# Patient Record
Sex: Female | Born: 1970 | Race: White | Hispanic: Yes | Marital: Married | Smoking: Never smoker
Health system: Southern US, Community
[De-identification: ages and names within clinical notes are randomized; demographics above are authoritative.]

## PROBLEM LIST (undated history)

## (undated) DIAGNOSIS — F32A Depression, unspecified: Secondary | ICD-10-CM

## (undated) DIAGNOSIS — I10 Essential (primary) hypertension: Secondary | ICD-10-CM

## (undated) DIAGNOSIS — F329 Major depressive disorder, single episode, unspecified: Secondary | ICD-10-CM

## (undated) DIAGNOSIS — F419 Anxiety disorder, unspecified: Secondary | ICD-10-CM

## (undated) HISTORY — DX: Major depressive disorder, single episode, unspecified: F32.9

## (undated) HISTORY — DX: Essential (primary) hypertension: I10

## (undated) HISTORY — PX: TONSILLECTOMY: SUR1361

## (undated) HISTORY — DX: Depression, unspecified: F32.A

## (undated) HISTORY — DX: Anxiety disorder, unspecified: F41.9

---

## 2003-11-17 ENCOUNTER — Inpatient Hospital Stay (HOSPITAL_COMMUNITY): Admission: AD | Admit: 2003-11-17 | Discharge: 2003-11-17 | Payer: Self-pay | Admitting: *Deleted

## 2003-12-27 ENCOUNTER — Inpatient Hospital Stay (HOSPITAL_COMMUNITY): Admission: AD | Admit: 2003-12-27 | Discharge: 2003-12-28 | Payer: Self-pay | Admitting: Obstetrics & Gynecology

## 2004-01-12 ENCOUNTER — Inpatient Hospital Stay (HOSPITAL_COMMUNITY): Admission: AD | Admit: 2004-01-12 | Discharge: 2004-01-12 | Payer: Self-pay | Admitting: *Deleted

## 2004-01-18 ENCOUNTER — Encounter: Admission: RE | Admit: 2004-01-18 | Discharge: 2004-01-18 | Payer: Self-pay | Admitting: *Deleted

## 2004-01-20 ENCOUNTER — Encounter: Admission: RE | Admit: 2004-01-20 | Discharge: 2004-01-20 | Payer: Self-pay | Admitting: *Deleted

## 2004-02-02 ENCOUNTER — Encounter: Admission: RE | Admit: 2004-02-02 | Discharge: 2004-02-02 | Payer: Self-pay | Admitting: *Deleted

## 2004-02-02 ENCOUNTER — Ambulatory Visit (HOSPITAL_COMMUNITY): Admission: RE | Admit: 2004-02-02 | Discharge: 2004-02-02 | Payer: Self-pay | Admitting: Obstetrics & Gynecology

## 2004-02-09 ENCOUNTER — Encounter: Admission: RE | Admit: 2004-02-09 | Discharge: 2004-02-09 | Payer: Self-pay | Admitting: *Deleted

## 2004-02-23 ENCOUNTER — Encounter: Admission: RE | Admit: 2004-02-23 | Discharge: 2004-02-23 | Payer: Self-pay | Admitting: *Deleted

## 2004-03-01 ENCOUNTER — Ambulatory Visit (HOSPITAL_COMMUNITY): Admission: RE | Admit: 2004-03-01 | Discharge: 2004-03-01 | Payer: Self-pay | Admitting: *Deleted

## 2004-03-08 ENCOUNTER — Encounter: Admission: RE | Admit: 2004-03-08 | Discharge: 2004-03-08 | Payer: Self-pay | Admitting: *Deleted

## 2004-03-15 ENCOUNTER — Encounter: Admission: RE | Admit: 2004-03-15 | Discharge: 2004-03-15 | Payer: Self-pay | Admitting: *Deleted

## 2004-03-22 ENCOUNTER — Encounter: Admission: RE | Admit: 2004-03-22 | Discharge: 2004-03-22 | Payer: Self-pay | Admitting: *Deleted

## 2004-03-29 ENCOUNTER — Inpatient Hospital Stay (HOSPITAL_COMMUNITY): Admission: AD | Admit: 2004-03-29 | Discharge: 2004-03-31 | Payer: Self-pay | Admitting: Obstetrics and Gynecology

## 2004-03-29 ENCOUNTER — Encounter: Admission: RE | Admit: 2004-03-29 | Discharge: 2004-03-29 | Payer: Self-pay | Admitting: *Deleted

## 2004-10-23 IMAGING — US US OB FOLLOW-UP
1 series · 13 of 28 positions shown · non-contrast
Comparison: none

CLINICAL DATA: Lower abdominal pain.  G4 P3 (twins) TAB1.  LMP 07/03/03.  History of placental previa.

[Series 1: unknown · 0.30mm/px · 13 of 48 slices shown]
[im 2/48]
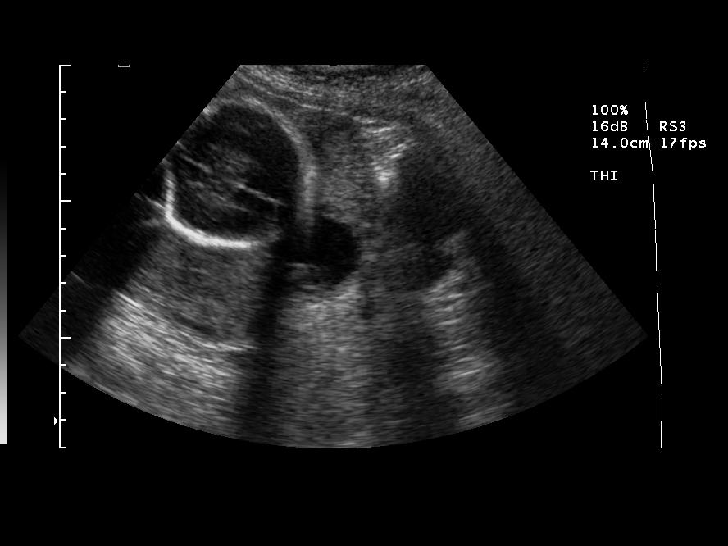
[im 6/48]
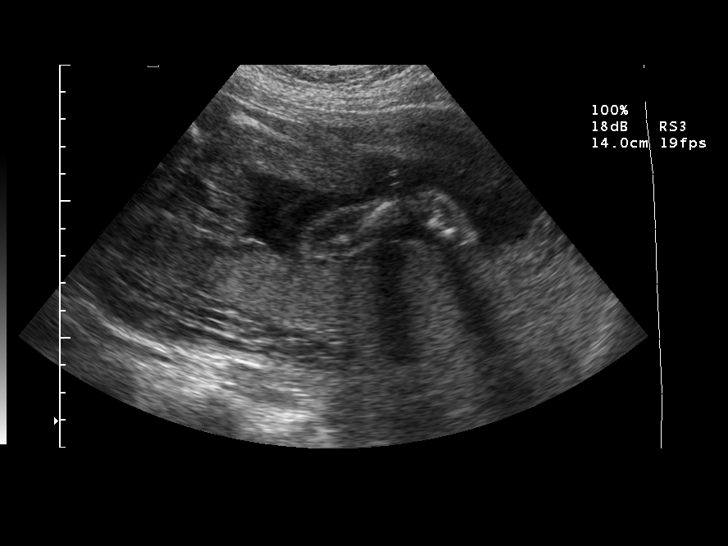
[im 9/48]
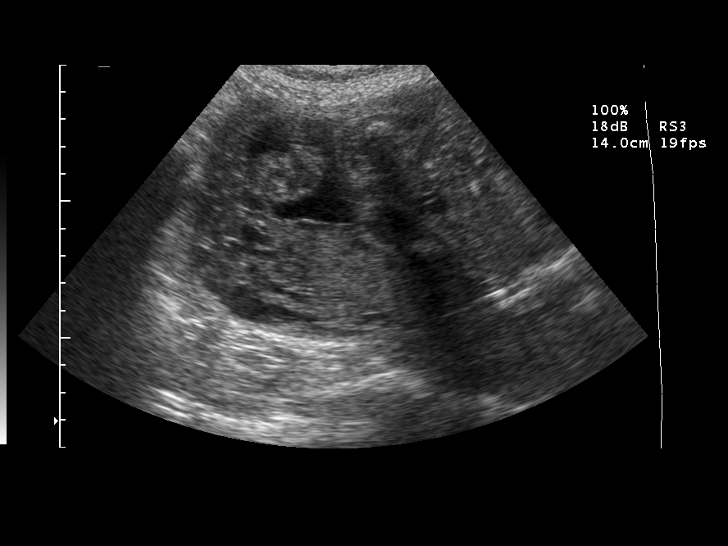
[im 13/48]
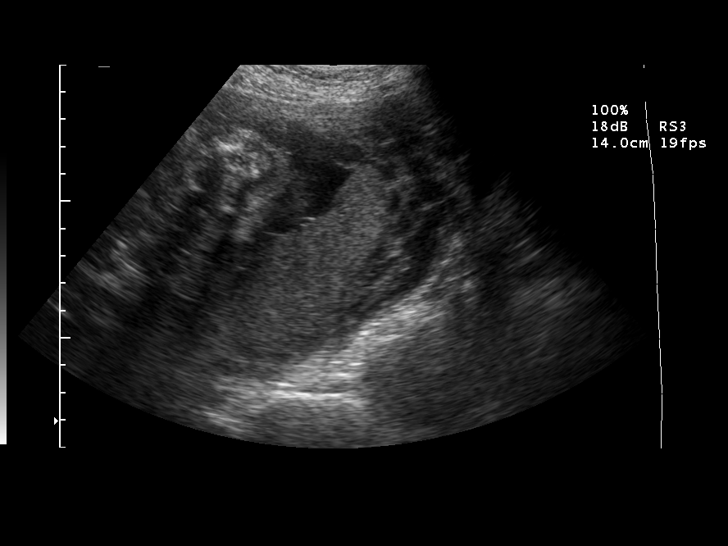
[im 16/48]
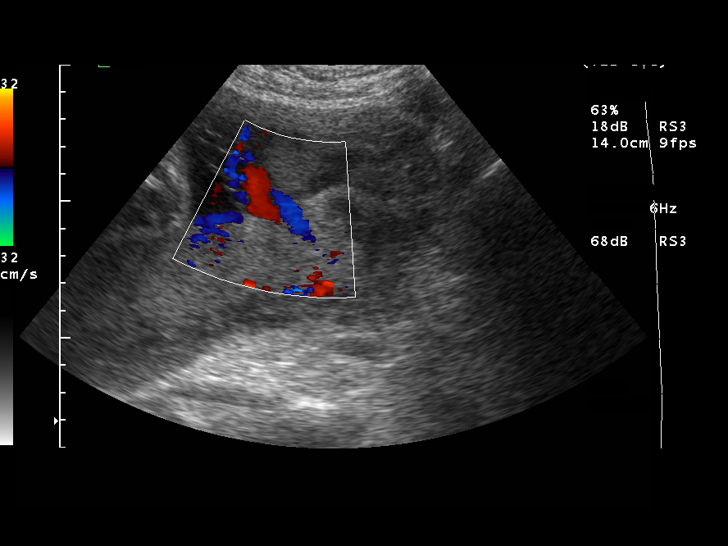
[im 20/48]
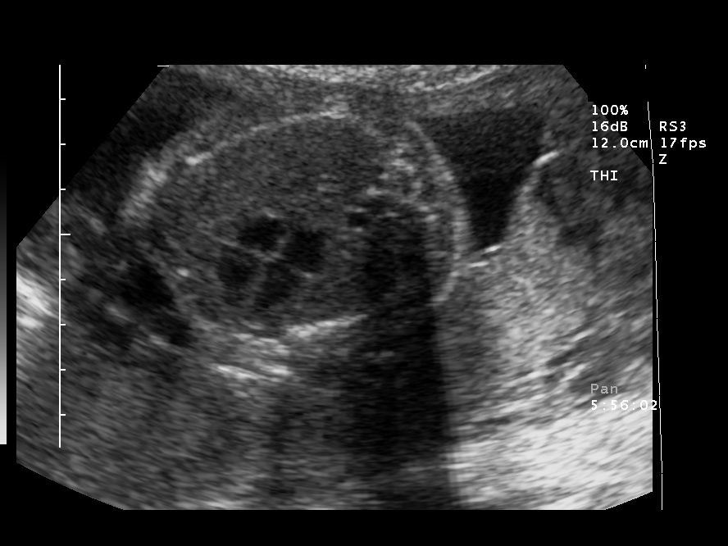
[im 25/48]
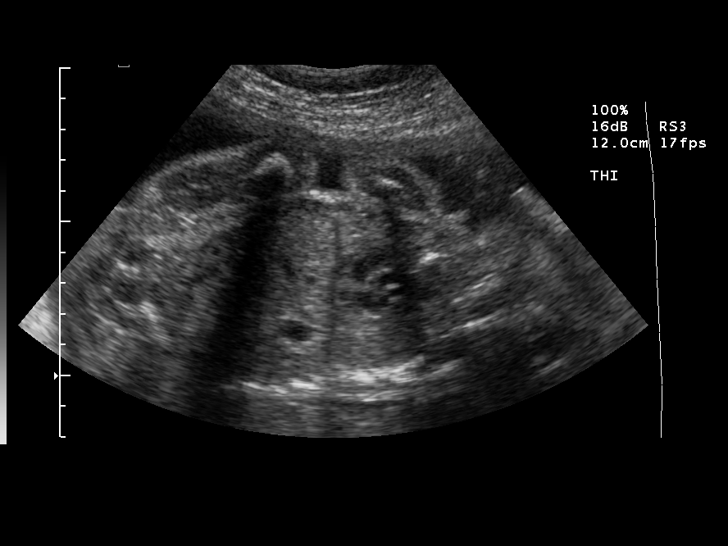
[im 28/48]
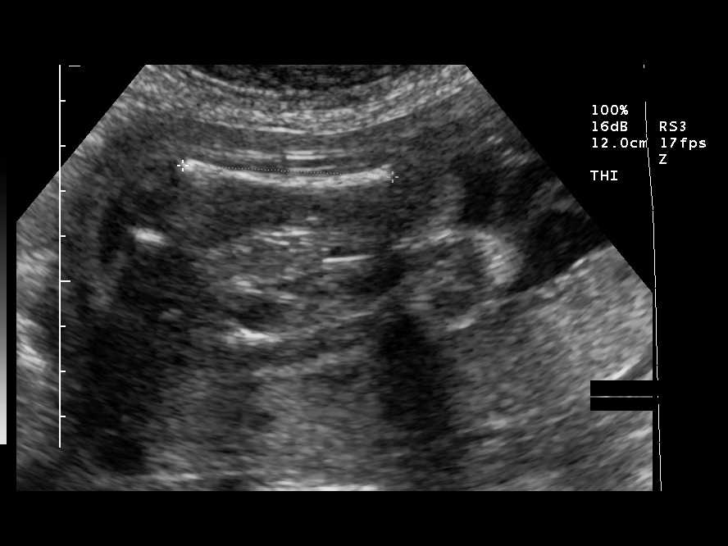
[im 32/48]
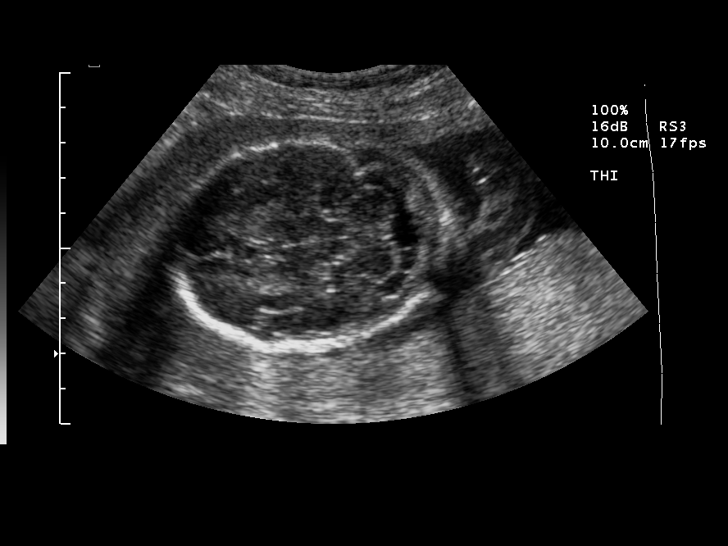
[im 35/48]
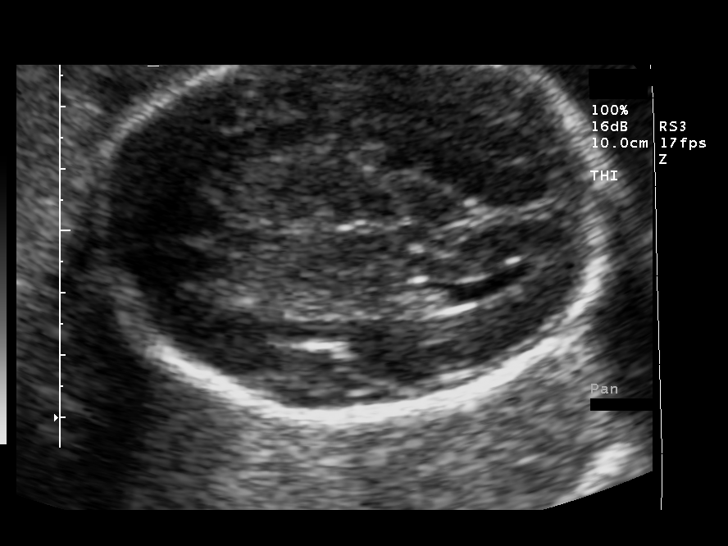
[im 39/48]
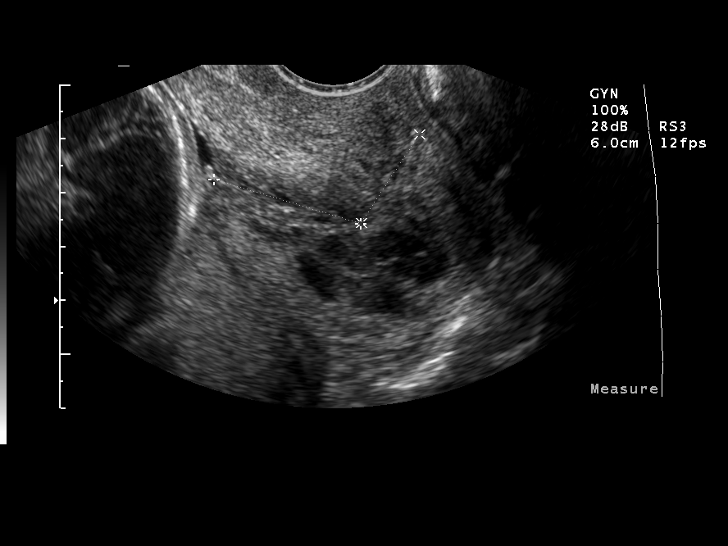
[im 42/48]
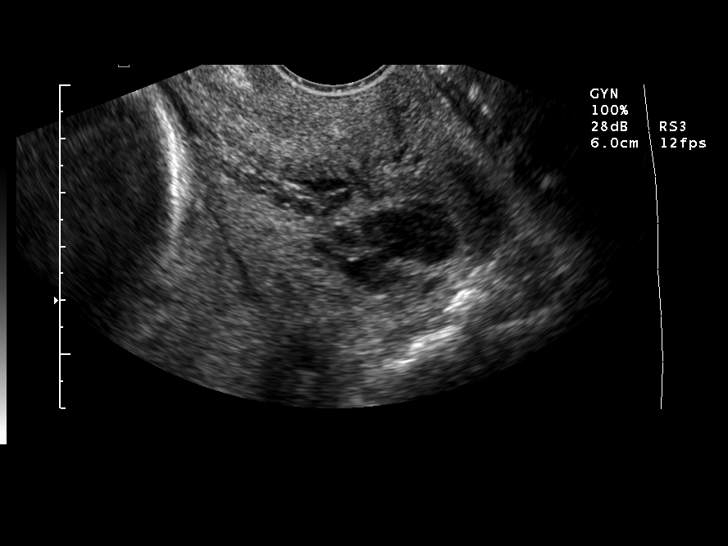
[im 46/48]
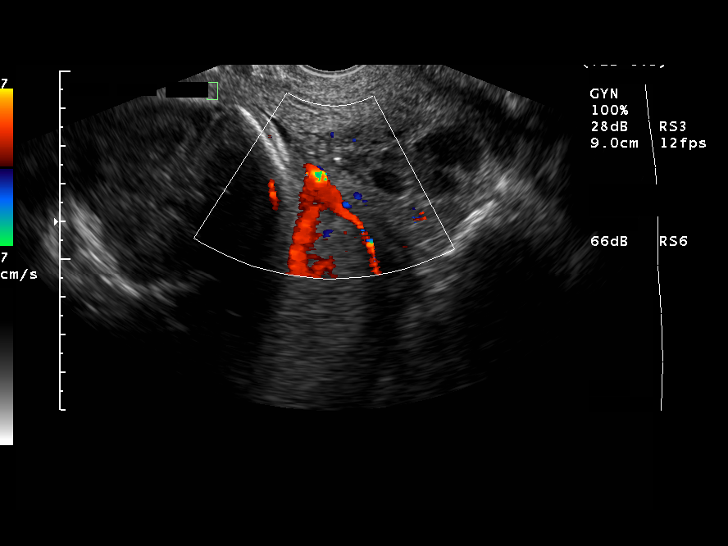

[13 of 28 positions shown; findings below may reference images not displayed]

OBSTETRICAL ULTRASOUND RE-EVALUATION WITH TRANSVAGINAL
Number of Fetuses:  1
Heart Rate: 147
Movement:  Yes
Breathing:  No
Presentation:  Cephalic
Placental Location:  Posterior
Grade:  I
Previa:  Marginal
Comment:  The preliminary report suggested the possibility of vasa previa, but upon examination of the images it appears as though the insertion of the umbilical cord is not at the margin of the placenta.  The vessels noted at the internal os may be marginal placental vessels rather than fetal umbilical vessels.  A follow-up study later in pregnancy may be helpful for further evaluation.  No succenturiate lobe was identified.
Amniotic Fluid (subjective):  Normal
Amniotic Fluid (objective):  4.2 cm Vertical pocket 

FETAL BIOMETRY
BPD:  5.9 cm   24 w 0 d
HC:  23.0 cm   25 w 0 d
AC:  21.1 cm   25 w 4 d
FL:  4.7 cm   25 w 4 d

Mean GA:  25 w 0 d
Assigned GA:  25 w 2 d (1st US)
EFW:  819 g (H) 75th ? 90th%ile (772 ? 889 g) For 25 weeks

FETAL ANATOMY
Lateral Ventricles:  Visualized 
Thalami/CSP:  Visualized 
Posterior Fossa:  Visualized 
Nuchal Region:  Previously seen 
Spine:  Previously seen 
4 Chamber Heart on Left:  Visualized 
Stomach on Left:  Visualized 
3 Vessel Cord:  Visualized 
Cord Insertion Site:  Not visualized 
Kidneys:  Visualized 
Bladder:  Visualized 
Extremities:  Previously seen 

MATERNAL FINDINGS
Cervix:  4.9 cm Transvaginally
IMPRESSION: Single living intrauterine fetus in cephalic presentation.  Patient?s assigned gestational age is 25 weeks 2 days by first ultrasound.  She measures 25 weeks today with a fetal weight of 819 grams, falling between the 75th and 90th percentile for 25 weeks.  Growth is appropriate.  
Normal amniotic fluid volume.
Posterior marginal placenta previa.  The preliminary report suggested the possibility of a vasa previa, however the insertion of the umbilical cord is seen within the placental body and not at the margin of the placenta.  This makes the vessels seen at the internal os more likely to be marginal placental vessels than fetal umbilical vessels.  Follow-up study later in pregnancy may be helpful for further evaluation.
This study was performed as an on-call procedure and was initially reviewed by Dr. Gaeta at the time it was performed.

## 2004-11-08 IMAGING — US US OB LIMITED
1 series · 14 of 28 positions shown · non-contrast
Comparison: none

CLINICAL DATA: Abdominal pain.  History of previa.  Assess cervical length and reevaluate previa.  G4 P3 (twins) AB1.  LMP 09/03/03.

[Series 1: unknown · 0.30mm/px · 14 of 28 slices shown]
[im 2/28]
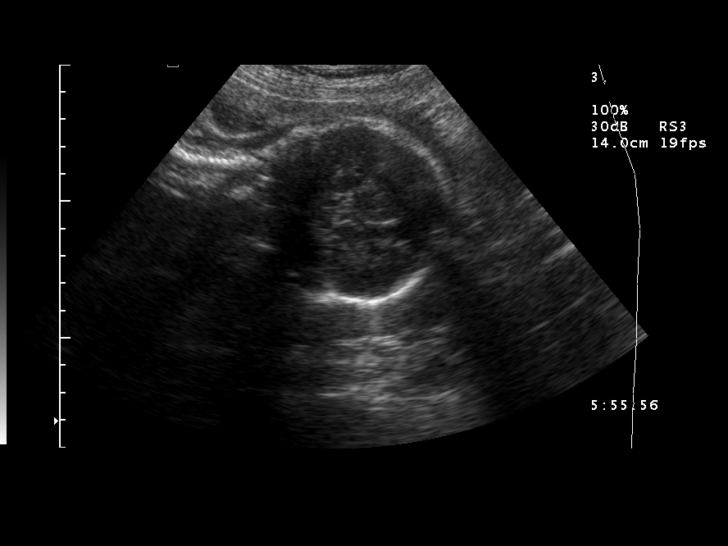
[im 4/28]
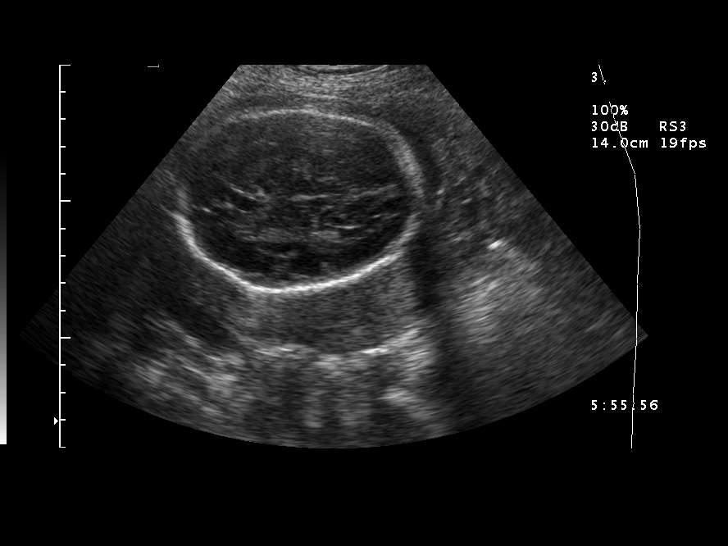
[im 6/28]
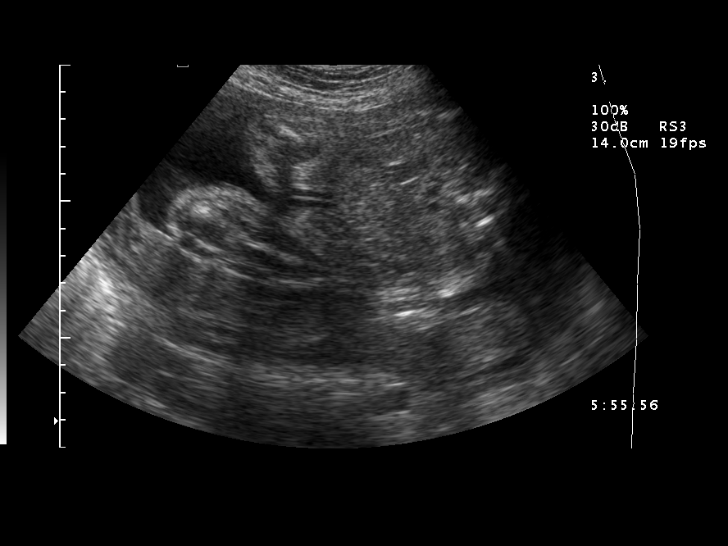
[im 8/28]
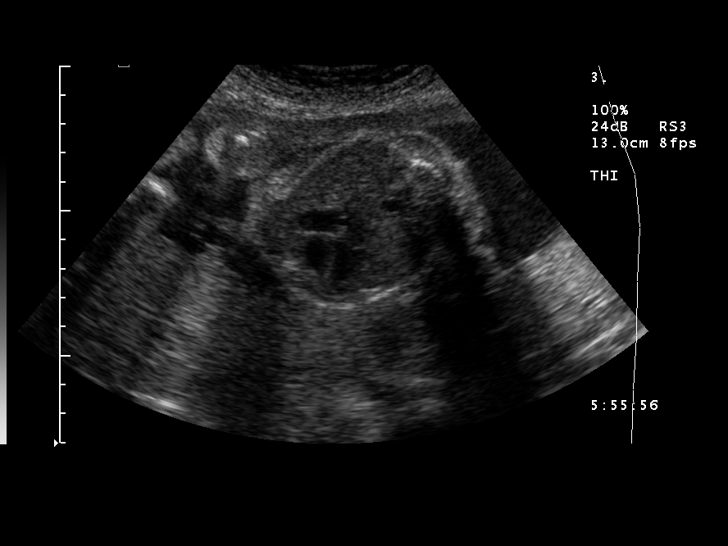
[im 10/28]
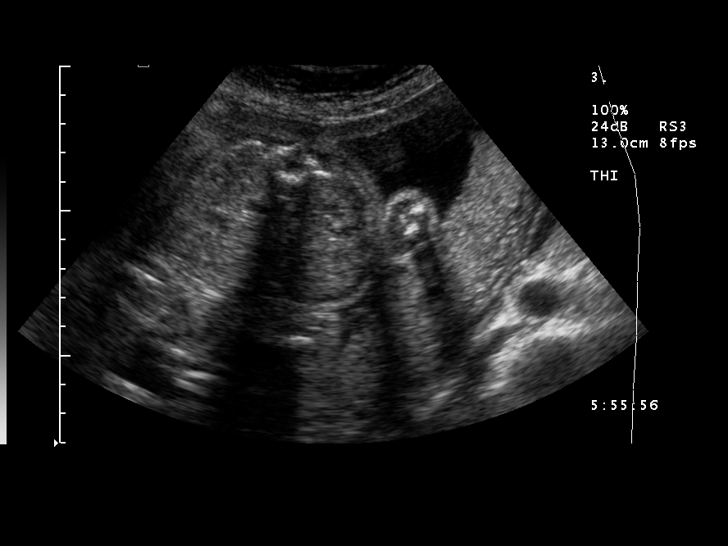
[im 12/28]
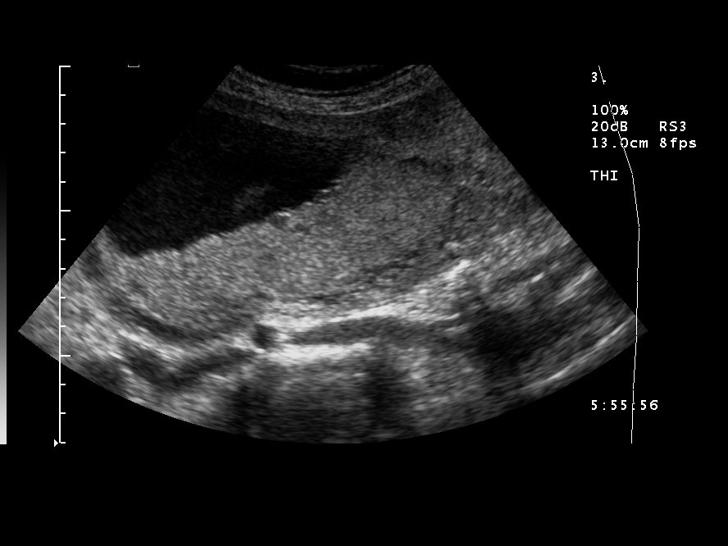
[im 14/28]
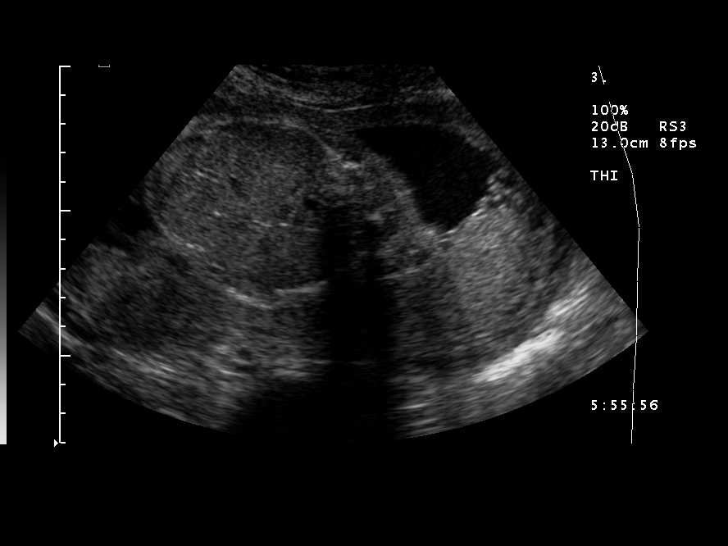
[im 16/28]
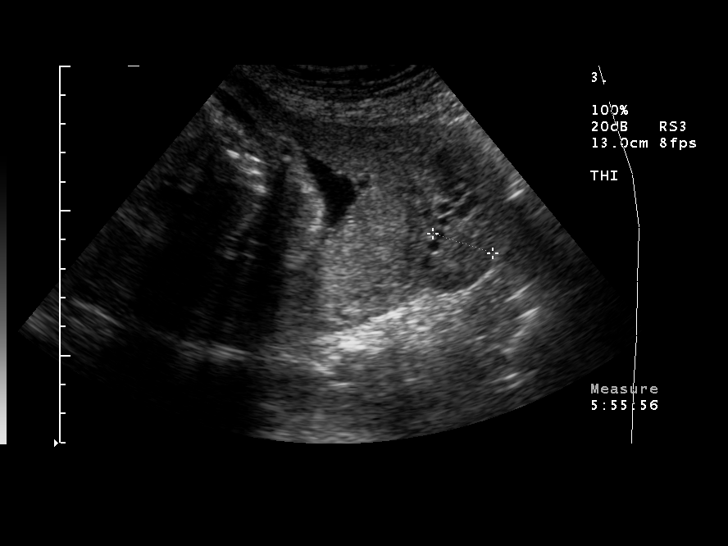
[im 18/28]
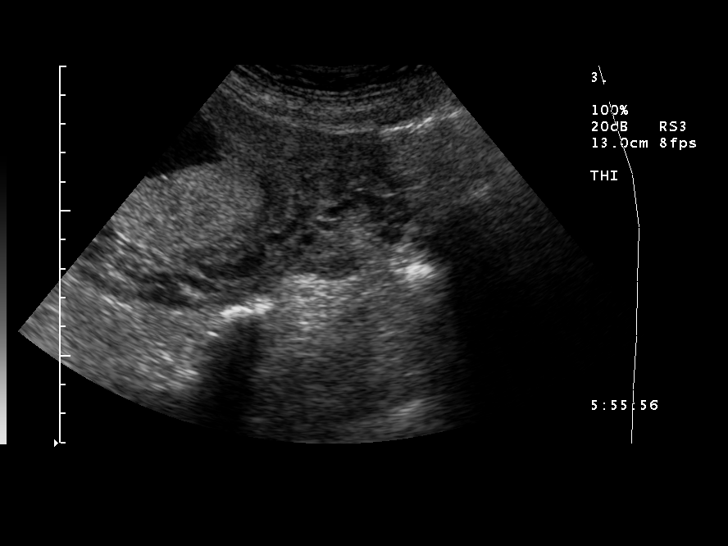
[im 20/28]
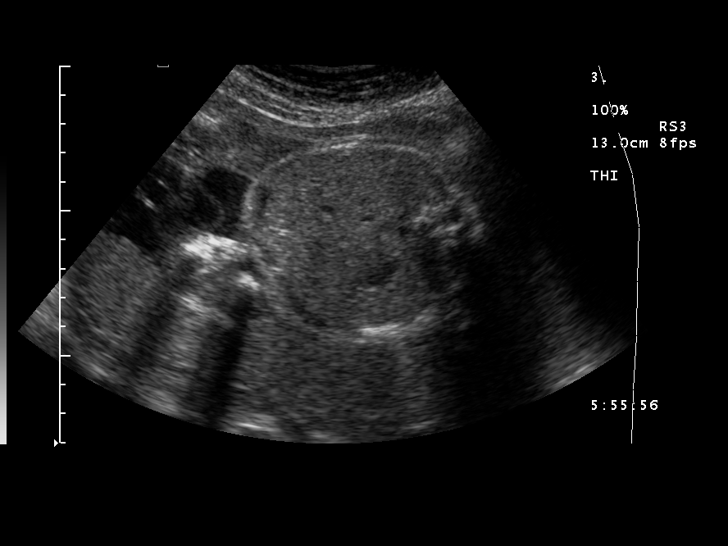
[im 22/28]
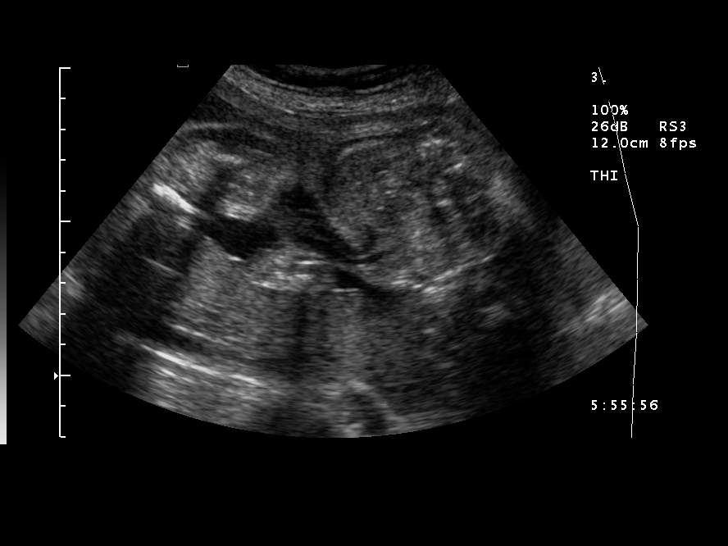
[im 24/28]
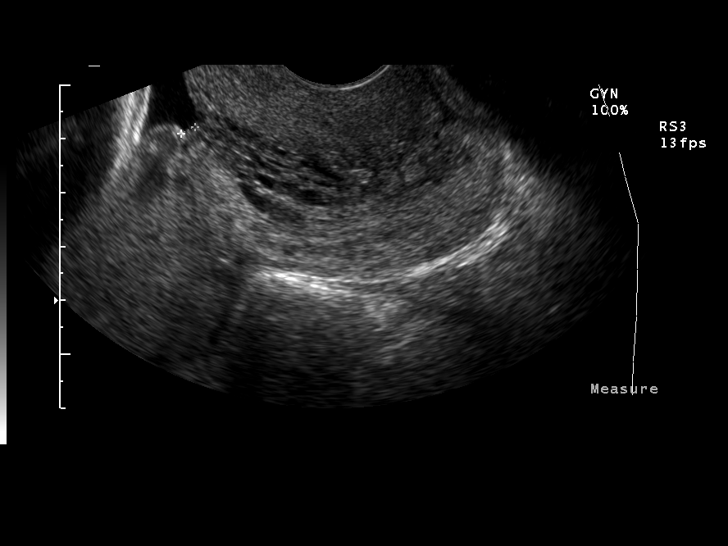
[im 26/28]
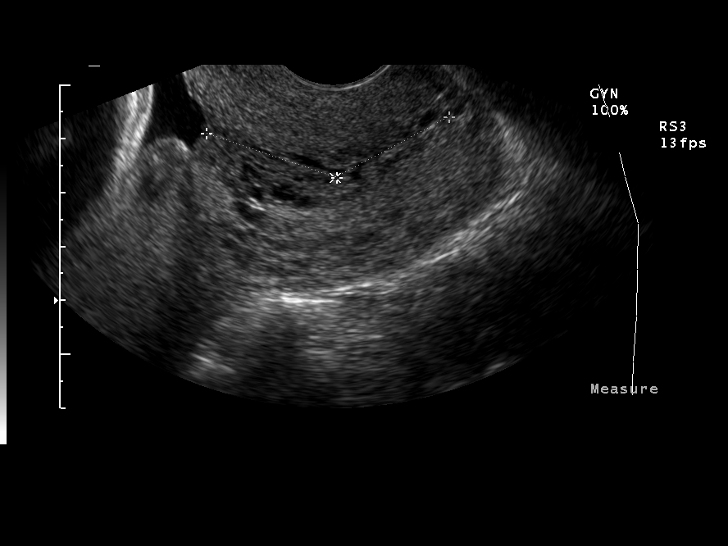
[im 28/28]
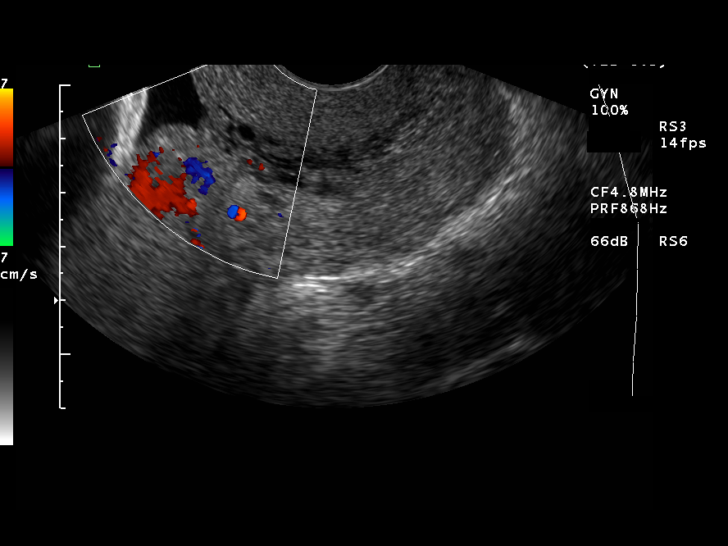

[14 of 28 positions shown; findings below may reference images not displayed]

LIMITED OBSTETRICAL ULTRASOUND WITH TRANSVAGINAL:
 Number of Fetuses:  1
 Heart Rate:  150
 Movement:  Yes
 Breathing:  No
 Presentation:  Cephalic
 Placental Location:  Posterior
 Grade:  I
 Previa:  Marginal
 Amniotic Fluid (Subjective):  Normal
 Amniotic Fluid (Objective):  5.0 cm Vertical pocket 

 Fetal measurements and complete anatomic evaluation were not requested.  The following fetal anatomy was visualized during this exam:  Lateral ventricles, thalami, four chamber heart, stomach, cord insertion site, kidneys and bladder.

 MATERNAL FINDINGS
 Cervix:  5.0 cm Transvaginally
IMPRESSION: Single living intrauterine fetus in cephalic presentation.  Posterior marginal placenta previa again noted.  
 Normal amniotic fluid volume.  
 Normal cervical length of 5 cm on transvaginal scanning. 
 This study was performed as an on-call procedure and was reviewed by Dr. Elisaul Aviila at the time it was performed.

## 2004-12-27 IMAGING — US US OB FOLLOW-UP
1 series · 13 of 28 positions shown · non-contrast
Comparison: none

CLINICAL DATA: Assess growth.  Follow-up possible previa.  G4 P3 (twins) AB1.  LMP 07/03/03.

[Series 1: unknown · 13 of 34 slices shown]
[im 2/34]
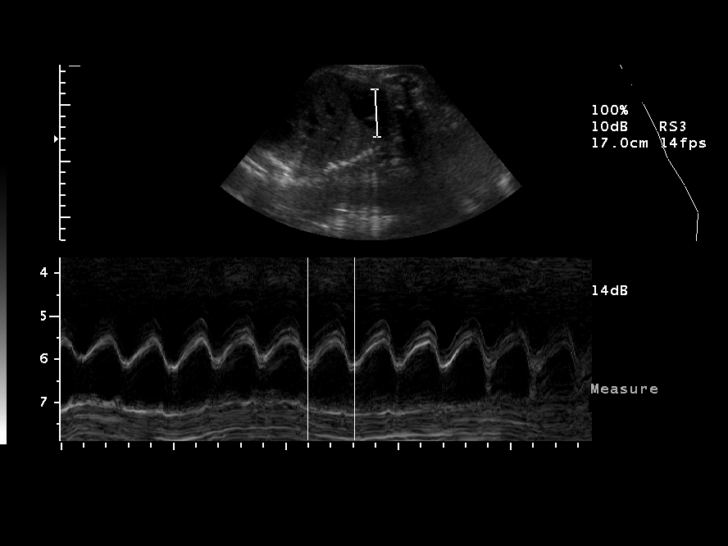
[im 4/34]
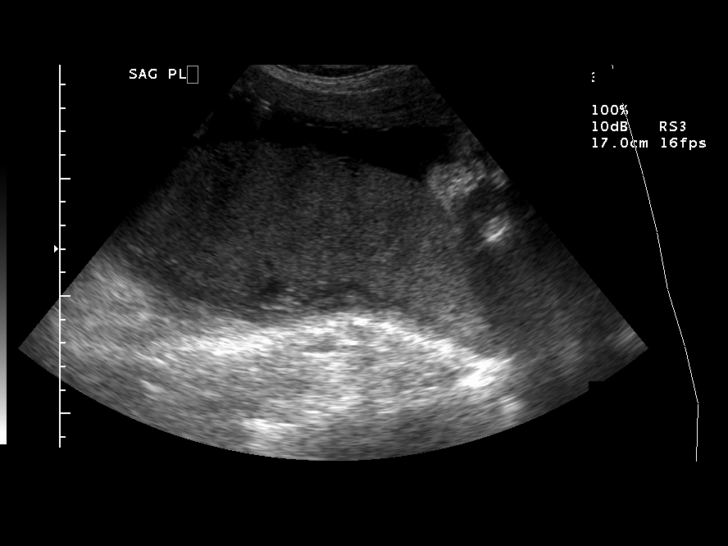
[im 7/34]
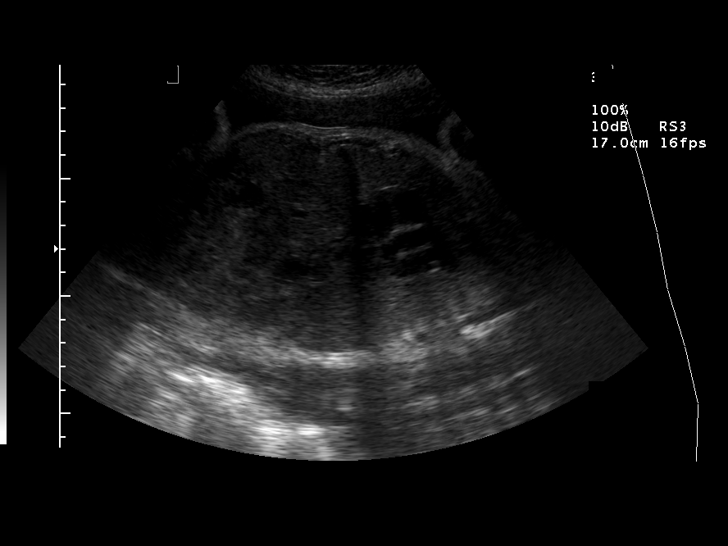
[im 9/34]
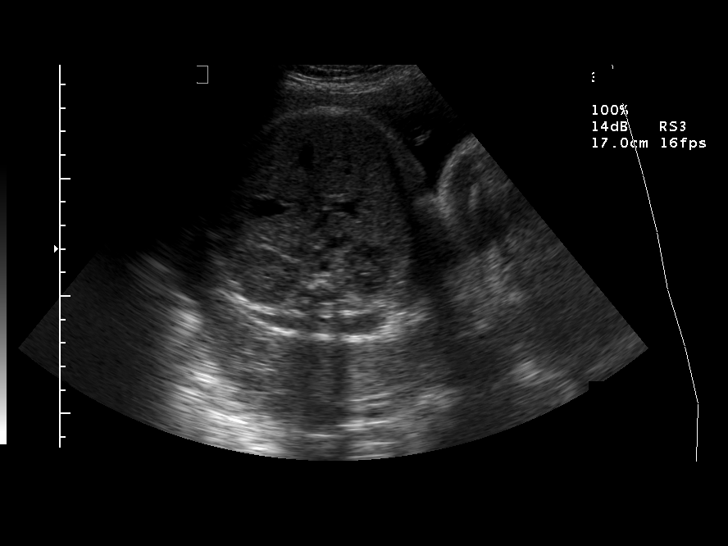
[im 12/34]
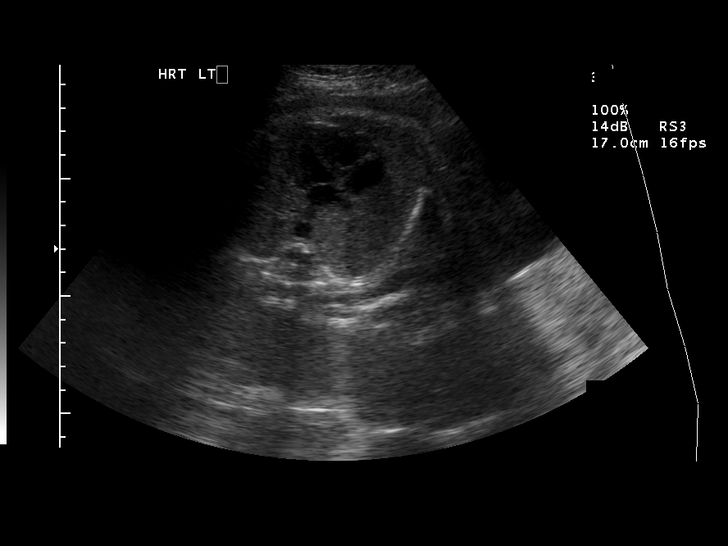
[im 14/34]
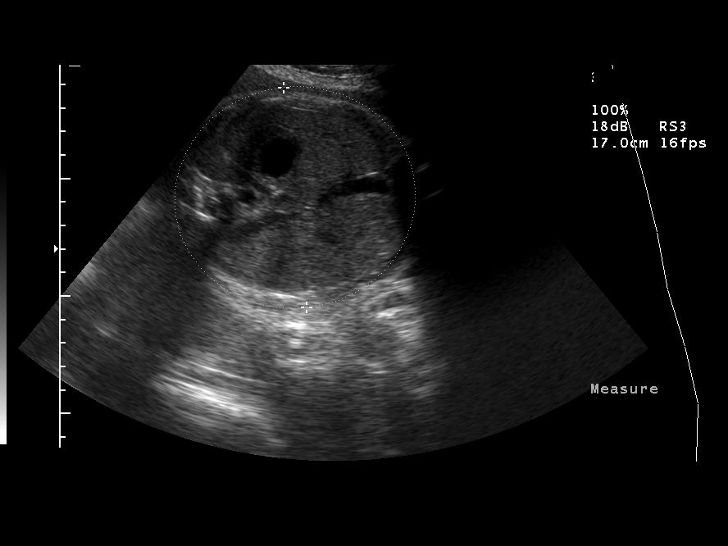
[im 18/34]
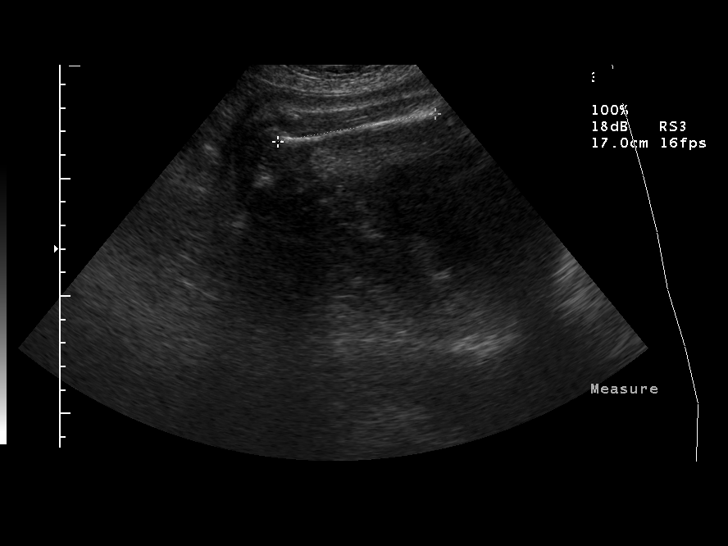
[im 20/34]
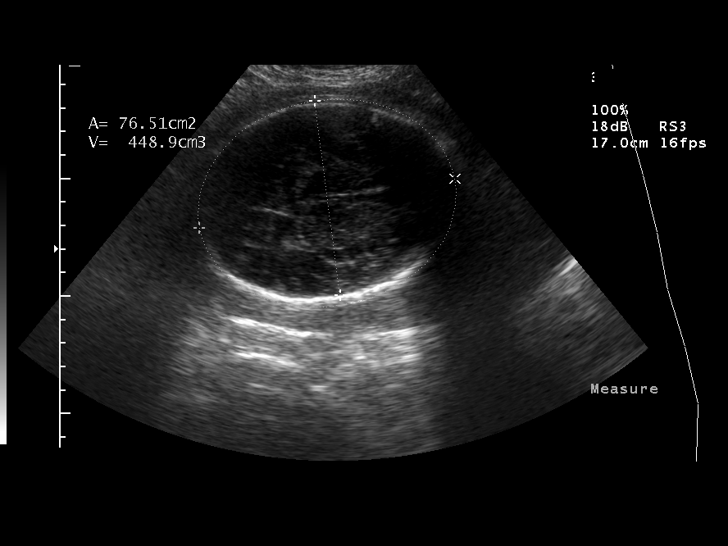
[im 23/34]
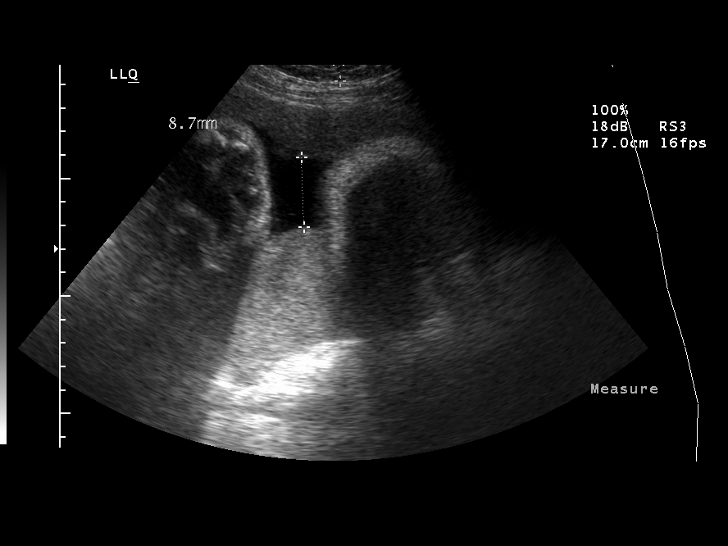
[im 25/34]
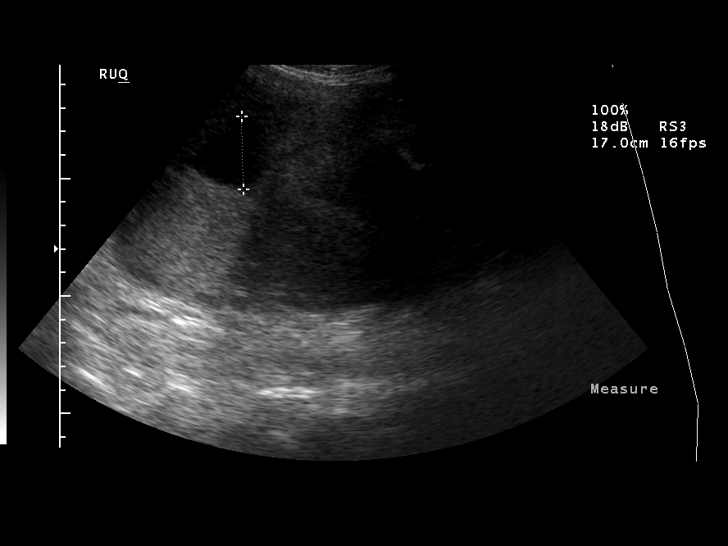
[im 27/34]
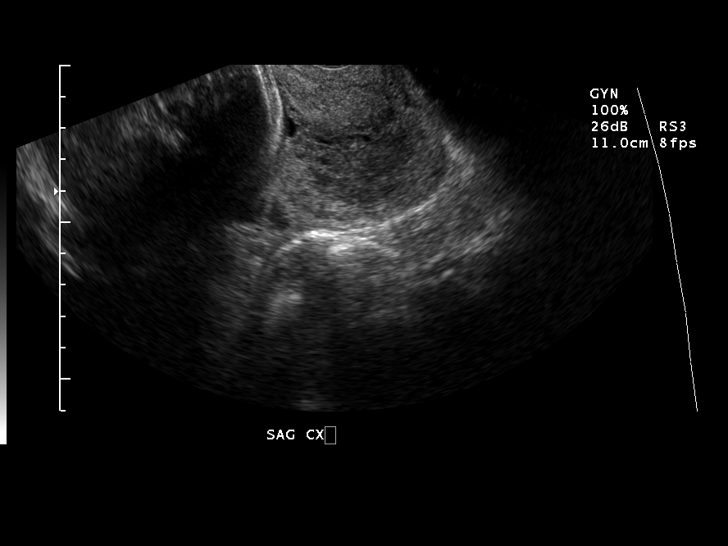
[im 30/34]
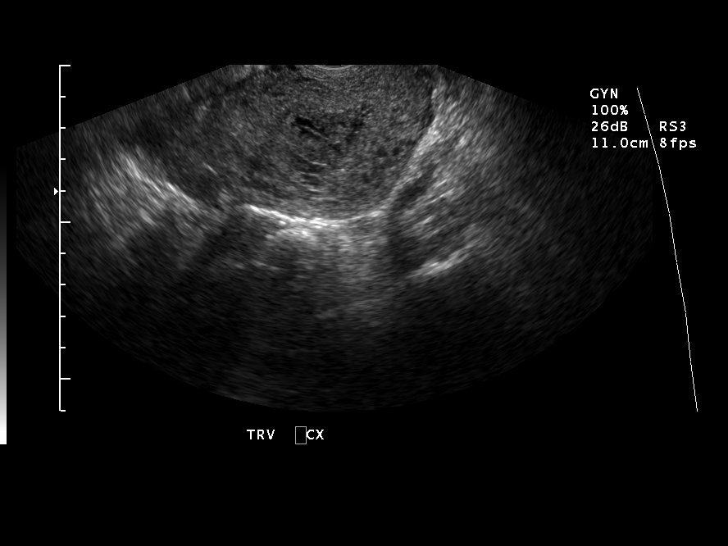
[im 32/34]
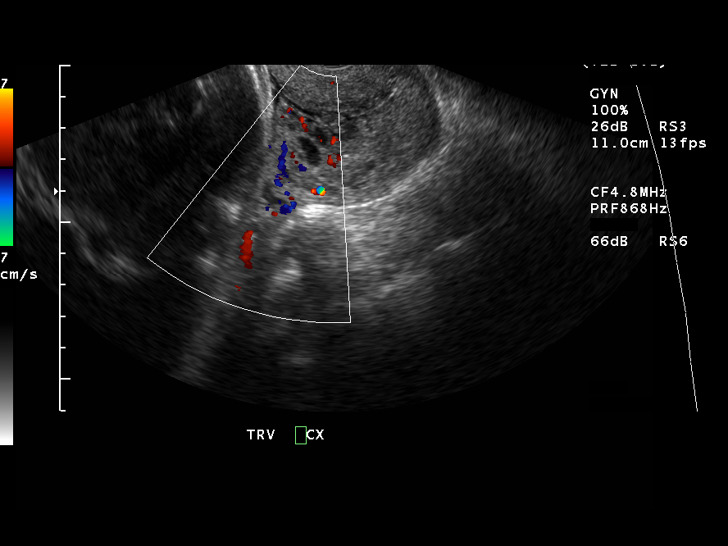

[13 of 28 positions shown; findings below may reference images not displayed]

OBSTETRICAL ULTRASOUND RE-EVALUATION WITH TRANSVAGINAL
 Number of Fetuses:  1
 Heart Rate:  145
 Movement:  Yes
 Breathing:  Yes
 Presentation:  Cephalic
 Placental Location:  Posterior
 Grade:  I
 Previa:  No
 Amniotic Fluid (subjective):  Normal
 Amniotic Fluid (objective):  12.0 cm AFI (5th -95th%ile =   7.9 – 24.9 cm for 35 weeks)

 FETAL BIOMETRY
 BPD:  8.5 cm   34 w 0 d
 HC:  31.3 cm   35 w 0 d
 AC:  30.8 cm   34 w 5 d
 FL:  6.8 cm   34 w 6 d

 Mean GA:  34 w 5 d
 Assigned GA:  34 w 4 d (1st US and LMP)

 EFW:  5631 g (H) 50th – 75th%ile (8080 – 1244 g) For 35 wks

 FETAL ANATOMY
 Lateral Ventricles:  Previously seen 
 Thalami/CSP:  Previously seen 
 Posterior Fossa:  Previously seen 
 Nuchal Region:  N/A
 Spine:  Previously seen 
 4 Chamber Heart on Left:  Previously seen 
 Stomach on Left:  Visualized 
 3 Vessel Cord:  Previously seen 
 Cord Insertion Site:  Previously seen 
 Kidneys:  Visualized 
 Bladder:  Visualized 
 Extremities:  Previously seen 

 MATERNAL FINDINGS
 Cervix: 3.2 cm Transvaginally
IMPRESSION: Single living intrauterine fetus in cephalic presentation.  Patient is 34 weeks 4 days by LMP dating and measures 34 weeks 5 days today with a fetal weight of 5631 grams, falling between the 50th and 75th percentile for 35 weeks.  Growth is appropriate.  
 Normal amniotic fluid volume with AFI 12 cm.
 There is no longer any evidence of placenta previa.  The lower margin is at least 4 cm above the internal os.
 Normal cervical length of 3.2 cm on transvaginally scanning.

## 2007-07-03 ENCOUNTER — Emergency Department (HOSPITAL_COMMUNITY): Admission: EM | Admit: 2007-07-03 | Discharge: 2007-07-03 | Payer: Self-pay | Admitting: Emergency Medicine

## 2011-10-25 ENCOUNTER — Emergency Department (HOSPITAL_COMMUNITY)
Admission: EM | Admit: 2011-10-25 | Discharge: 2011-10-26 | Disposition: A | Discharge status: Home / Self Care | Payer: Self-pay | Attending: Emergency Medicine | Admitting: Emergency Medicine

## 2011-10-25 DIAGNOSIS — R110 Nausea: Secondary | ICD-10-CM | POA: Insufficient documentation

## 2011-10-25 DIAGNOSIS — R109 Unspecified abdominal pain: Secondary | ICD-10-CM | POA: Insufficient documentation

## 2011-10-25 LAB — CBC
HCT: 40.5 % (ref 36.0–46.0)
Hemoglobin: 14.6 g/dL (ref 12.0–15.0)
MCH: 31.5 pg (ref 26.0–34.0)
MCHC: 36 g/dL (ref 30.0–36.0)
MCV: 87.3 fL (ref 78.0–100.0)
RBC: 4.64 MIL/uL (ref 3.87–5.11)

## 2011-10-25 LAB — BASIC METABOLIC PANEL
BUN: 7 mg/dL (ref 6–23)
CO2: 23 mEq/L (ref 19–32)
Calcium: 9.3 mg/dL (ref 8.4–10.5)
Creatinine, Ser: 0.55 mg/dL (ref 0.50–1.10)
GFR calc non Af Amer: >90 mL/min (ref >90)
Glucose, Bld: 183 mg/dL — ABNORMAL HIGH (ref 70–99)

## 2011-10-25 LAB — DIFFERENTIAL
Basophils Relative: 0 % (ref 0–1)
Lymphs Abs: 3 10*3/uL (ref 0.7–4.0)
Monocytes Absolute: 0.4 10*3/uL (ref 0.1–1.0)
Monocytes Relative: 4 % (ref 3–12)
Neutro Abs: 7.5 10*3/uL (ref 1.7–7.7)

## 2011-10-25 LAB — URINALYSIS, ROUTINE W REFLEX MICROSCOPIC
Glucose, UA: NEGATIVE mg/dL
Hgb urine dipstick: NEGATIVE
Specific Gravity, Urine: 1.008 (ref 1.005–1.030)

## 2011-10-25 LAB — POCT PREGNANCY, URINE: Preg Test, Ur: NEGATIVE

## 2011-10-26 ENCOUNTER — Emergency Department (HOSPITAL_COMMUNITY): Payer: Self-pay

## 2011-10-26 LAB — HEPATIC FUNCTION PANEL
ALT: 12 U/L (ref 0–35)
Albumin: 4.1 g/dL (ref 3.5–5.2)
Alkaline Phosphatase: 69 U/L (ref 39–117)
Total Protein: 7.8 g/dL (ref 6.0–8.3)

## 2011-10-26 LAB — LIPASE, BLOOD: Lipase: 32 U/L (ref 11–59)

## 2011-10-26 MED ORDER — IOHEXOL 300 MG/ML  SOLN
80.0000 mL | Freq: Once | INTRAMUSCULAR | Status: AC | PRN
  Administered 2011-10-26: 80 mL via INTRAVENOUS

## 2014-04-07 ENCOUNTER — Encounter: Payer: Self-pay | Admitting: Obstetrics & Gynecology

## 2014-04-07 ENCOUNTER — Ambulatory Visit (INDEPENDENT_AMBULATORY_CARE_PROVIDER_SITE_OTHER): Payer: Self-pay | Admitting: Obstetrics & Gynecology

## 2014-04-07 VITALS — BP 152/93 | HR 98 | Temp 98.1°F | Resp 20 | Wt 150.6 lb

## 2014-04-07 DIAGNOSIS — D219 Benign neoplasm of connective and other soft tissue, unspecified: Secondary | ICD-10-CM

## 2014-04-07 DIAGNOSIS — N852 Hypertrophy of uterus: Secondary | ICD-10-CM | POA: Insufficient documentation

## 2014-04-07 DIAGNOSIS — N920 Excessive and frequent menstruation with regular cycle: Secondary | ICD-10-CM

## 2014-04-07 DIAGNOSIS — D259 Leiomyoma of uterus, unspecified: Secondary | ICD-10-CM

## 2014-04-07 LAB — CBC
HCT: 38.8 % (ref 36.0–46.0)
Hemoglobin: 13.4 g/dL (ref 12.0–15.0)
MCH: 29.1 pg (ref 26.0–34.0)
MCHC: 34.5 g/dL (ref 30.0–36.0)
MCV: 84.3 fL (ref 78.0–100.0)
Platelets: 329 10*3/uL (ref 150–400)
RBC: 4.6 MIL/uL (ref 3.87–5.11)
RDW: 15.2 % (ref 11.5–15.5)
WBC: 8.6 10*3/uL (ref 4.0–10.5)

## 2014-04-07 LAB — TSH: TSH: 1.972 u[IU]/mL (ref 0.350–4.500)

## 2014-04-07 NOTE — Assessment & Plan Note (Addendum)
Check CBC, TSH, pelvic ultrasound Likely secondary to fibroids Anticipate will need abdominal hysterectomy. F/u in 2 weeks to review u/s and likely schedule surgery

## 2014-04-07 NOTE — Progress Notes (Signed)
  Due to language barrier, an interpreter was present during the history-taking and subsequent discussion.  HPI:  Pt presents for a new GYN appointment. She was referred to [facility] by the health department for an enlarged uterus. She notes that especially for the last 2 years, she has had painful, heavy periods. She gets periods every 18-20 days. Does have some bleeding in between periods as well. Over the last 2-3 months her uterus has gotten even larger. Uses four pads/day when bleeding is heaviest. Has not had any ultrasounds.  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 Had colposcopy at that time and since has had normal yearly paps, thinks her last pap was 1.5 years ago. Was tested for STD's one month ago at [facility] and that was negative.  Sexually active with one female partner. Wants BTL, is done with bearing children.  ROS: See HPI  OB Hx: [id number] (hx twin delivery, one 3 month miscarriage, all other pregnancies SVD's at term, GDM with last 2 periods)  PMHx: HTN & depression/anxiety  Surgical Hx: hx tonsillectomy, no prior abdominal surgeries  PHYSICAL EXAM: BP 152/93  Pulse 98  Temp(Src) 98.1 F (36.7 C) (Oral)  Resp 20  Wt 150 lb 9.6 oz (68.312 kg)  LMP 03/23/2014 Gen: NAD, pleasant, cooperative HEENT: NCAT Lungs: NWOB Abdomen: soft, nontender to palpation. Uterus enlarged up to level of umbilicus, approximately 18-20 week in size GU: deferred Neuro: grossly nonfocal, speech normal  ASSESSMENT/PLAN:  See problem-based charting for assessment/plan.  FOLLOW UP: F/u in 2 weeks to review u/s and likely schedule surgery.  [place]. Ardelia Mems, MD Family Medicine PGY-2

## 2014-04-07 NOTE — Progress Notes (Signed)
Ultrasound scheduled for 04/14/14 at 0730

## 2014-04-14 ENCOUNTER — Ambulatory Visit (HOSPITAL_COMMUNITY)
Admission: RE | Admit: 2014-04-14 | Discharge: 2014-04-14 | Disposition: A | Discharge status: Home / Self Care | Payer: Self-pay | Source: Ambulatory Visit | Attending: Obstetrics & Gynecology | Admitting: Obstetrics & Gynecology

## 2014-04-14 DIAGNOSIS — N938 Other specified abnormal uterine and vaginal bleeding: Secondary | ICD-10-CM | POA: Insufficient documentation

## 2014-04-14 DIAGNOSIS — N949 Unspecified condition associated with female genital organs and menstrual cycle: Secondary | ICD-10-CM | POA: Insufficient documentation

## 2014-04-14 DIAGNOSIS — D259 Leiomyoma of uterus, unspecified: Secondary | ICD-10-CM | POA: Insufficient documentation

## 2014-04-14 DIAGNOSIS — N920 Excessive and frequent menstruation with regular cycle: Secondary | ICD-10-CM | POA: Insufficient documentation

## 2014-04-14 DIAGNOSIS — D219 Benign neoplasm of connective and other soft tissue, unspecified: Secondary | ICD-10-CM

## 2014-04-19 ENCOUNTER — Encounter: Payer: Self-pay | Admitting: *Deleted

## 2014-05-05 ENCOUNTER — Ambulatory Visit (INDEPENDENT_AMBULATORY_CARE_PROVIDER_SITE_OTHER): Payer: Self-pay | Admitting: Obstetrics & Gynecology

## 2014-05-05 ENCOUNTER — Encounter: Payer: Self-pay | Admitting: Obstetrics & Gynecology

## 2014-05-05 VITALS — BP 145/85 | HR 93 | Temp 97.4°F | Resp 20 | Wt 149.4 lb

## 2014-05-05 DIAGNOSIS — D259 Leiomyoma of uterus, unspecified: Secondary | ICD-10-CM

## 2014-05-05 DIAGNOSIS — D219 Benign neoplasm of connective and other soft tissue, unspecified: Secondary | ICD-10-CM

## 2014-05-05 NOTE — Progress Notes (Signed)
   Subjective:    Patient ID: Jenna Noble, female    DOB: April 15, 1971, 43 y.o.   MRN: [id number]  HPI This 43 yo MH P5 is here to schedule surgery for her large painful fibroid uterus. She also reports heavy painful periods. She denies dyspareunia but rarely has orgasms. She describes herself as "frigid".    Review of Systems Her last pap was reportedly about a year ago and she says that they have always been normal.    Objective:   Physical Exam Uterus at u-3       Assessment & Plan:  Symptomatic fibroid uterus Plan for TAH/BS.  She understands the risks of surgery, including, but not to infection, bleeding, DVTs, damage to bowel, bladder, ureters. She wishes to proceed.

## 2014-06-24 ENCOUNTER — Telehealth: Payer: Self-pay | Admitting: *Deleted

## 2014-06-24 NOTE — Telephone Encounter (Signed)
Pt called clinic this morning to discuss some concerns. I spoke to her with interpreter - Dorita. She reported that her abdomen is very swollen and uncomfortable. She feels it became worse about 3 weeks ago. She states that sometimes she cannot eat because of the swelling, pressure and also has pain. She is scheduled for surgery (hysterectomy) on 7/27 and wants to know if there is anything she can do before the surgery. I advised pt that she can try OTC Zantac which may help her be able to eat. There is nothing that can be done about her swollen abdomen prior to surgery. She has a very large fibroid which is causing the pain and pressure. If she feels it becomes unbearable and has severe pain, she should go to [facility] for evaluation. Pt voiced understanding.

## 2014-07-16 ENCOUNTER — Other Ambulatory Visit: Payer: Self-pay

## 2014-07-16 ENCOUNTER — Encounter (HOSPITAL_COMMUNITY): Payer: Self-pay

## 2014-07-16 ENCOUNTER — Encounter (HOSPITAL_COMMUNITY)
Admission: RE | Admit: 2014-07-16 | Discharge: 2014-07-16 | Disposition: A | Discharge status: Home / Self Care | Payer: Self-pay | Source: Ambulatory Visit | Attending: Obstetrics & Gynecology | Admitting: Obstetrics & Gynecology

## 2014-07-16 DIAGNOSIS — Z01818 Encounter for other preprocedural examination: Secondary | ICD-10-CM | POA: Insufficient documentation

## 2014-07-16 DIAGNOSIS — Z01812 Encounter for preprocedural laboratory examination: Secondary | ICD-10-CM | POA: Insufficient documentation

## 2014-07-16 LAB — BASIC METABOLIC PANEL
Anion gap: 16 — ABNORMAL HIGH (ref 5–15)
BUN: 6 mg/dL (ref 6–23)
CO2: 22 mEq/L (ref 19–32)
Calcium: 9.6 mg/dL (ref 8.4–10.5)
Chloride: 99 mEq/L (ref 96–112)
Creatinine, Ser: 0.6 mg/dL (ref 0.50–1.10)
GFR calc Af Amer: >90 mL/min (ref >90)
GFR calc non Af Amer: >90 mL/min (ref >90)
Glucose, Bld: 110 mg/dL — ABNORMAL HIGH (ref 70–99)
Potassium: 3.4 mEq/L — ABNORMAL LOW (ref 3.7–5.3)
Sodium: 137 mEq/L (ref 137–147)

## 2014-07-16 LAB — CBC
HCT: 38.7 % (ref 36.0–46.0)
Hemoglobin: 13.2 g/dL (ref 12.0–15.0)
MCH: 28.5 pg (ref 26.0–34.0)
MCHC: 34.1 g/dL (ref 30.0–36.0)
MCV: 83.6 fL (ref 78.0–100.0)
Platelets: 268 10*3/uL (ref 150–400)
RBC: 4.63 MIL/uL (ref 3.87–5.11)
RDW: 15 % (ref 11.5–15.5)
WBC: 7.9 10*3/uL (ref 4.0–10.5)

## 2014-07-16 NOTE — Patient Instructions (Signed)
Garvin Laurin Coder  07/16/2014   Your procedure is scheduled on:  07/19/14  Enter through the Main Entrance of [facility] at [place] up the phone at the desk and dial [phone].   Call this number if you have problems the morning of surgery: [phone]   Remember:   Do not eat food:After Midnight.  Do not drink clear liquids: After Midnight.  Take these medicines the morning of surgery with A SIP OF WATER: NA   Do not wear jewelry, make-up or nail polish.  Do not wear lotions, powders, or perfumes. You may wear deodorant.  Do not shave 48 hours prior to surgery.  Do not bring valuables to the hospital.  [facility] is not   responsible for any belongings or valuables brought to the hospital.  Contacts, dentures or bridgework may not be worn into surgery.  Leave suitcase in the car. After surgery it may be brought to your room.  For patients admitted to the hospital, checkout time is 11:00 AM the day of              discharge.   Patients discharged the day of surgery will not be allowed to drive             home.  Name and phone number of your driver: NA  Special Instructions:      Please read over the following fact sheets that you were given:   Surgical Site Infection Prevention

## 2014-07-19 ENCOUNTER — Encounter (HOSPITAL_COMMUNITY): Payer: Self-pay | Admitting: Anesthesiology

## 2014-07-19 ENCOUNTER — Inpatient Hospital Stay (HOSPITAL_COMMUNITY)
Admission: RE | Admit: 2014-07-19 | Discharge: 2014-07-20 | DRG: 743 | Disposition: A | Discharge status: Home / Self Care | Payer: Self-pay | Source: Ambulatory Visit | Attending: Obstetrics & Gynecology | Admitting: Obstetrics & Gynecology

## 2014-07-19 ENCOUNTER — Encounter (HOSPITAL_COMMUNITY): Payer: Self-pay | Admitting: Certified Registered"

## 2014-07-19 ENCOUNTER — Inpatient Hospital Stay (HOSPITAL_COMMUNITY): Payer: Self-pay | Admitting: Anesthesiology

## 2014-07-19 ENCOUNTER — Encounter (HOSPITAL_COMMUNITY)
Admission: RE | Disposition: A | Discharge status: Home / Self Care | Payer: Self-pay | Source: Ambulatory Visit | Attending: Obstetrics & Gynecology

## 2014-07-19 DIAGNOSIS — N949 Unspecified condition associated with female genital organs and menstrual cycle: Secondary | ICD-10-CM | POA: Diagnosis present

## 2014-07-19 DIAGNOSIS — D251 Intramural leiomyoma of uterus: Secondary | ICD-10-CM

## 2014-07-19 DIAGNOSIS — N8000 Endometriosis of the uterus, unspecified: Secondary | ICD-10-CM

## 2014-07-19 DIAGNOSIS — F341 Dysthymic disorder: Secondary | ICD-10-CM | POA: Diagnosis present

## 2014-07-19 DIAGNOSIS — N838 Other noninflammatory disorders of ovary, fallopian tube and broad ligament: Secondary | ICD-10-CM | POA: Diagnosis present

## 2014-07-19 DIAGNOSIS — I10 Essential (primary) hypertension: Secondary | ICD-10-CM | POA: Diagnosis present

## 2014-07-19 DIAGNOSIS — Z9889 Other specified postprocedural states: Secondary | ICD-10-CM

## 2014-07-19 DIAGNOSIS — Z833 Family history of diabetes mellitus: Secondary | ICD-10-CM

## 2014-07-19 DIAGNOSIS — N920 Excessive and frequent menstruation with regular cycle: Secondary | ICD-10-CM | POA: Diagnosis present

## 2014-07-19 DIAGNOSIS — Z8249 Family history of ischemic heart disease and other diseases of the circulatory system: Secondary | ICD-10-CM

## 2014-07-19 HISTORY — PX: ABDOMINAL HYSTERECTOMY: SHX81

## 2014-07-19 LAB — URINALYSIS, ROUTINE W REFLEX MICROSCOPIC
Bilirubin Urine: NEGATIVE
GLUCOSE, UA: 100 mg/dL — AB
KETONES UR: NEGATIVE mg/dL
LEUKOCYTES UA: NEGATIVE
Nitrite: NEGATIVE
Protein, ur: 100 mg/dL — AB
SPECIFIC GRAVITY, URINE: 1.025 (ref 1.005–1.030)
Urobilinogen, UA: 0.2 mg/dL (ref 0.0–1.0)
pH: 5.5 (ref 5.0–8.0)

## 2014-07-19 LAB — URINE MICROSCOPIC-ADD ON

## 2014-07-19 LAB — PREGNANCY, URINE: PREG TEST UR: NEGATIVE

## 2014-07-19 SURGERY — HYSTERECTOMY, ABDOMINAL
Anesthesia: Spinal | Site: Abdomen | Laterality: Bilateral

## 2014-07-19 MED ORDER — ONDANSETRON HCL 4 MG PO TABS: 4.0000 mg | ORAL_TABLET | Freq: Four times a day (QID) | ORAL | Status: DC | PRN

## 2014-07-19 MED ORDER — LACTATED RINGERS IV SOLN
INTRAVENOUS | Status: DC
  Administered 2014-07-19 (×2): via INTRAVENOUS

## 2014-07-19 MED ORDER — FENTANYL CITRATE 0.05 MG/ML IJ SOLN
INTRAMUSCULAR | Status: AC
  Filled 2014-07-19: qty 5

## 2014-07-19 MED ORDER — KETOROLAC TROMETHAMINE 30 MG/ML IJ SOLN
INTRAMUSCULAR | Status: DC | PRN
  Administered 2014-07-19: 30 mg via INTRAVENOUS

## 2014-07-19 MED ORDER — DIPHENHYDRAMINE HCL 50 MG/ML IJ SOLN: 12.5000 mg | INTRAMUSCULAR | Status: DC | PRN

## 2014-07-19 MED ORDER — LIDOCAINE HCL (CARDIAC) 20 MG/ML IV SOLN
INTRAVENOUS | Status: AC
  Filled 2014-07-19: qty 5

## 2014-07-19 MED ORDER — PHENYLEPHRINE HCL 10 MG/ML IJ SOLN
INTRAMUSCULAR | Status: DC | PRN
  Administered 2014-07-19: 40 ug via INTRAVENOUS
  Administered 2014-07-19 (×3): 80 ug via INTRAVENOUS
  Administered 2014-07-19: 40 ug via INTRAVENOUS
  Administered 2014-07-19 (×2): 80 ug via INTRAVENOUS

## 2014-07-19 MED ORDER — CEFAZOLIN SODIUM-DEXTROSE 2-3 GM-% IV SOLR
INTRAVENOUS | Status: AC
  Administered 2014-07-19: 2 g via INTRAVENOUS
  Filled 2014-07-19: qty 50

## 2014-07-19 MED ORDER — PROMETHAZINE HCL 25 MG/ML IJ SOLN: 6.2500 mg | INTRAMUSCULAR | Status: DC | PRN

## 2014-07-19 MED ORDER — KETOROLAC TROMETHAMINE 30 MG/ML IJ SOLN: 30.0000 mg | Freq: Four times a day (QID) | INTRAMUSCULAR | Status: AC | PRN

## 2014-07-19 MED ORDER — MEPERIDINE HCL 25 MG/ML IJ SOLN: 6.2500 mg | INTRAMUSCULAR | Status: DC | PRN

## 2014-07-19 MED ORDER — ONDANSETRON HCL 4 MG/2ML IJ SOLN
4.0000 mg | Freq: Three times a day (TID) | INTRAMUSCULAR | Status: DC | PRN
  Filled 2014-07-19: qty 2

## 2014-07-19 MED ORDER — ACETAMINOPHEN 500 MG PO TABS
1000.0000 mg | ORAL_TABLET | Freq: Four times a day (QID) | ORAL | Status: AC
  Administered 2014-07-19 – 2014-07-20 (×3): 1000 mg via ORAL
  Filled 2014-07-19 (×3): qty 2

## 2014-07-19 MED ORDER — ROCURONIUM BROMIDE 100 MG/10ML IV SOLN
INTRAVENOUS | Status: AC
  Filled 2014-07-19: qty 1

## 2014-07-19 MED ORDER — MORPHINE SULFATE 0.5 MG/ML IJ SOLN
INTRAMUSCULAR | Status: AC
  Filled 2014-07-19: qty 10

## 2014-07-19 MED ORDER — NALBUPHINE HCL 10 MG/ML IJ SOLN: 5.0000 mg | INTRAMUSCULAR | Status: DC | PRN

## 2014-07-19 MED ORDER — DIPHENHYDRAMINE HCL 25 MG PO CAPS: 25.0000 mg | ORAL_CAPSULE | ORAL | Status: DC | PRN

## 2014-07-19 MED ORDER — BISOPROLOL-HYDROCHLOROTHIAZIDE 5-6.25 MG PO TABS
1.0000 | ORAL_TABLET | Freq: Every day | ORAL | Status: DC
  Administered 2014-07-19: 1 via ORAL

## 2014-07-19 MED ORDER — METOCLOPRAMIDE HCL 5 MG/ML IJ SOLN
10.0000 mg | Freq: Three times a day (TID) | INTRAMUSCULAR | Status: DC | PRN
  Administered 2014-07-19: 10 mg via INTRAVENOUS
  Filled 2014-07-19: qty 2

## 2014-07-19 MED ORDER — FENTANYL CITRATE 0.05 MG/ML IJ SOLN
INTRAMUSCULAR | Status: DC | PRN
  Administered 2014-07-19 (×5): 50 ug via INTRAVENOUS

## 2014-07-19 MED ORDER — ONDANSETRON HCL 4 MG/2ML IJ SOLN
INTRAMUSCULAR | Status: DC | PRN
  Administered 2014-07-19: 4 mg via INTRAVENOUS

## 2014-07-19 MED ORDER — PAROXETINE HCL 10 MG PO TABS
5.0000 mg | ORAL_TABLET | Freq: Every day | ORAL | Status: DC
  Administered 2014-07-19: 5 mg via ORAL
  Filled 2014-07-19 (×2): qty 0.5

## 2014-07-19 MED ORDER — CLONAZEPAM 0.5 MG PO TABS
0.5000 mg | ORAL_TABLET | Freq: Every day | ORAL | Status: DC
  Administered 2014-07-19: 0.5 mg via ORAL
  Filled 2014-07-19: qty 1

## 2014-07-19 MED ORDER — IBUPROFEN 800 MG PO TABS: 800.0000 mg | ORAL_TABLET | Freq: Three times a day (TID) | ORAL | Status: DC | PRN

## 2014-07-19 MED ORDER — NEOSTIGMINE METHYLSULFATE 10 MG/10ML IV SOLN
INTRAVENOUS | Status: AC
  Filled 2014-07-19: qty 1

## 2014-07-19 MED ORDER — ONDANSETRON HCL 4 MG/2ML IJ SOLN
4.0000 mg | Freq: Four times a day (QID) | INTRAMUSCULAR | Status: DC | PRN
  Administered 2014-07-19: 4 mg via INTRAVENOUS

## 2014-07-19 MED ORDER — PROPOFOL 10 MG/ML IV EMUL
INTRAVENOUS | Status: AC
  Filled 2014-07-19: qty 20

## 2014-07-19 MED ORDER — MIDAZOLAM HCL 2 MG/2ML IJ SOLN
INTRAMUSCULAR | Status: DC | PRN
  Administered 2014-07-19: 2 mg via INTRAVENOUS

## 2014-07-19 MED ORDER — PROPOFOL 10 MG/ML IV BOLUS
INTRAVENOUS | Status: DC | PRN
  Administered 2014-07-19: 20 mg via INTRAVENOUS
  Administered 2014-07-19: 30 mg via INTRAVENOUS
  Administered 2014-07-19 (×3): 20 mg via INTRAVENOUS
  Administered 2014-07-19 (×2): 40 mg via INTRAVENOUS
  Administered 2014-07-19: 20 mg via INTRAVENOUS
  Administered 2014-07-19: 50 mg via INTRAVENOUS
  Administered 2014-07-19: 20 mg via INTRAVENOUS
  Administered 2014-07-19: 40 mg via INTRAVENOUS

## 2014-07-19 MED ORDER — MIDAZOLAM HCL 2 MG/2ML IJ SOLN
INTRAMUSCULAR | Status: AC
  Filled 2014-07-19: qty 2

## 2014-07-19 MED ORDER — HYDROMORPHONE HCL PF 1 MG/ML IJ SOLN: 0.2000 mg | INTRAMUSCULAR | Status: DC | PRN

## 2014-07-19 MED ORDER — ONDANSETRON HCL 4 MG/2ML IJ SOLN
INTRAMUSCULAR | Status: AC
  Filled 2014-07-19: qty 2

## 2014-07-19 MED ORDER — DEXAMETHASONE SODIUM PHOSPHATE 10 MG/ML IJ SOLN
INTRAMUSCULAR | Status: AC
  Filled 2014-07-19: qty 1

## 2014-07-19 MED ORDER — DEXAMETHASONE SODIUM PHOSPHATE 10 MG/ML IJ SOLN
INTRAMUSCULAR | Status: DC | PRN
  Administered 2014-07-19: 10 mg via INTRAVENOUS

## 2014-07-19 MED ORDER — PHENYLEPHRINE 40 MCG/ML (10ML) SYRINGE FOR IV PUSH (FOR BLOOD PRESSURE SUPPORT)
PREFILLED_SYRINGE | INTRAVENOUS | Status: AC
  Filled 2014-07-19: qty 5

## 2014-07-19 MED ORDER — DIPHENHYDRAMINE HCL 50 MG/ML IJ SOLN: 25.0000 mg | INTRAMUSCULAR | Status: DC | PRN

## 2014-07-19 MED ORDER — LACTATED RINGERS IV SOLN
INTRAVENOUS | Status: DC
  Administered 2014-07-19: 10:00:00 via INTRAVENOUS

## 2014-07-19 MED ORDER — CEFAZOLIN SODIUM-DEXTROSE 2-3 GM-% IV SOLR: 2.0000 g | INTRAVENOUS | Status: DC

## 2014-07-19 MED ORDER — KETOROLAC TROMETHAMINE 30 MG/ML IJ SOLN
INTRAMUSCULAR | Status: AC
  Filled 2014-07-19: qty 1

## 2014-07-19 MED ORDER — BUPIVACAINE HCL (PF) 0.5 % IJ SOLN
INTRAMUSCULAR | Status: AC
  Filled 2014-07-19: qty 30

## 2014-07-19 MED ORDER — IBUPROFEN 600 MG PO TABS
600.0000 mg | ORAL_TABLET | Freq: Four times a day (QID) | ORAL | Status: DC | PRN
  Administered 2014-07-20: 600 mg via ORAL
  Filled 2014-07-19: qty 1

## 2014-07-19 MED ORDER — GLYCOPYRROLATE 0.2 MG/ML IJ SOLN
INTRAMUSCULAR | Status: AC
  Filled 2014-07-19: qty 3

## 2014-07-19 MED ORDER — BUPIVACAINE IN DEXTROSE 0.75-8.25 % IT SOLN
INTRATHECAL | Status: DC | PRN
  Administered 2014-07-19: 1.8 mL via INTRATHECAL

## 2014-07-19 MED ORDER — SUCCINYLCHOLINE CHLORIDE 20 MG/ML IJ SOLN
INTRAMUSCULAR | Status: AC
  Filled 2014-07-19: qty 10

## 2014-07-19 MED ORDER — NALOXONE HCL 1 MG/ML IJ SOLN
1.0000 ug/kg/h | INTRAMUSCULAR | Status: DC | PRN
  Filled 2014-07-19: qty 2

## 2014-07-19 MED ORDER — BISOPROLOL-HYDROCHLOROTHIAZIDE 5-6.25 MG PO TABS
0.5000 | ORAL_TABLET | Freq: Every day | ORAL | Status: DC
  Filled 2014-07-19: qty 0.5

## 2014-07-19 MED ORDER — NALOXONE HCL 0.4 MG/ML IJ SOLN: 0.4000 mg | INTRAMUSCULAR | Status: DC | PRN

## 2014-07-19 MED ORDER — BUPIVACAINE HCL (PF) 0.5 % IJ SOLN
INTRAMUSCULAR | Status: DC | PRN
  Administered 2014-07-19: 30 mL

## 2014-07-19 MED ORDER — MORPHINE SULFATE (PF) 0.5 MG/ML IJ SOLN
INTRAMUSCULAR | Status: DC | PRN
Start: 2014-07-19 — End: 2014-07-19
  Administered 2014-07-19: .15 mg via INTRATHECAL
  Administered 2014-07-19: .85 mg via INTRAVENOUS

## 2014-07-19 MED ORDER — LIDOCAINE HCL (CARDIAC) 20 MG/ML IV SOLN
INTRAVENOUS | Status: DC | PRN
  Administered 2014-07-19: 80 mg via INTRAVENOUS

## 2014-07-19 MED ORDER — SODIUM CHLORIDE 0.9 % IJ SOLN: 3.0000 mL | INTRAMUSCULAR | Status: DC | PRN

## 2014-07-19 MED ORDER — EPHEDRINE SULFATE 50 MG/ML IJ SOLN
INTRAMUSCULAR | Status: DC | PRN
  Administered 2014-07-19 (×2): 10 mg via INTRAVENOUS

## 2014-07-19 MED ORDER — HYDROMORPHONE HCL PF 1 MG/ML IJ SOLN: 0.2500 mg | INTRAMUSCULAR | Status: DC | PRN

## 2014-07-19 MED ORDER — OXYCODONE-ACETAMINOPHEN 5-325 MG PO TABS: 1.0000 | ORAL_TABLET | ORAL | Status: DC | PRN

## 2014-07-19 MED ORDER — EPHEDRINE 5 MG/ML INJ
INTRAVENOUS | Status: AC
  Filled 2014-07-19: qty 10

## 2014-07-19 MED ORDER — SCOPOLAMINE 1 MG/3DAYS TD PT72
1.0000 | MEDICATED_PATCH | Freq: Once | TRANSDERMAL | Status: DC
  Filled 2014-07-19: qty 1

## 2014-07-19 SURGICAL SUPPLY — 36 items
BLADE SURG 10 STRL SS (BLADE) ×6 IMPLANT
CANISTER SUCT 3000ML (MISCELLANEOUS) ×3 IMPLANT
CLOSURE WOUND 1/2 X4 (GAUZE/BANDAGES/DRESSINGS) ×1
CLOTH BEACON ORANGE TIMEOUT ST (SAFETY) ×3 IMPLANT
CONT PATH 16OZ SNAP LID 3702 (MISCELLANEOUS) ×3 IMPLANT
DECANTER SPIKE VIAL GLASS SM (MISCELLANEOUS) IMPLANT
DRAPE WARM FLUID 44X44 (DRAPE) ×3 IMPLANT
DRSG OPSITE POSTOP 4X10 (GAUZE/BANDAGES/DRESSINGS) ×3 IMPLANT
DRSG TELFA 3X8 NADH (GAUZE/BANDAGES/DRESSINGS) ×3 IMPLANT
DURAPREP 26ML APPLICATOR (WOUND CARE) ×9 IMPLANT
GAUZE SPONGE 4X4 16PLY XRAY LF (GAUZE/BANDAGES/DRESSINGS) ×3 IMPLANT
GLOVE BIO SURGEON STRL SZ 6.5 (GLOVE) ×2 IMPLANT
GLOVE BIO SURGEONS STRL SZ 6.5 (GLOVE) ×1
GLOVE BIOGEL PI IND STRL 7.0 (GLOVE) ×2 IMPLANT
GLOVE BIOGEL PI INDICATOR 7.0 (GLOVE) ×4
GOWN STRL REUS W/TWL LRG LVL3 (GOWN DISPOSABLE) ×9 IMPLANT
HEMOSTAT SURGICEL 2X14 (HEMOSTASIS) ×3 IMPLANT
NEEDLE SPNL 18GX3.5 QUINCKE PK (NEEDLE) ×3 IMPLANT
NS IRRIG 1000ML POUR BTL (IV SOLUTION) ×6 IMPLANT
PACK ABDOMINAL GYN (CUSTOM PROCEDURE TRAY) ×3 IMPLANT
PAD ABD 7.5X8 STRL (GAUZE/BANDAGES/DRESSINGS) ×3 IMPLANT
PAD OB MATERNITY 4.3X12.25 (PERSONAL CARE ITEMS) ×3 IMPLANT
PROTECTOR NERVE ULNAR (MISCELLANEOUS) ×3 IMPLANT
SPONGE GAUZE 4X4 12PLY STER LF (GAUZE/BANDAGES/DRESSINGS) ×3 IMPLANT
SPONGE LAP 18X18 X RAY DECT (DISPOSABLE) ×6 IMPLANT
STRIP CLOSURE SKIN 1/2X4 (GAUZE/BANDAGES/DRESSINGS) ×2 IMPLANT
SUT CHROMIC 3 0 SH 27 (SUTURE) IMPLANT
SUT VIC AB 0 CT1 36 (SUTURE) ×9 IMPLANT
SUT VIC AB 2-0 CT1 18 (SUTURE) ×9 IMPLANT
SUT VIC AB 3-0 CT1 27 (SUTURE) ×2
SUT VIC AB 3-0 CT1 TAPERPNT 27 (SUTURE) ×1 IMPLANT
SYR 30ML LL (SYRINGE) IMPLANT
TAPE CLOTH SURG 4X10 WHT LF (GAUZE/BANDAGES/DRESSINGS) ×3 IMPLANT
TOWEL OR 17X24 6PK STRL BLUE (TOWEL DISPOSABLE) ×6 IMPLANT
TRAY FOLEY CATH 14FR (SET/KITS/TRAYS/PACK) ×3 IMPLANT
WATER STERILE IRR 1000ML POUR (IV SOLUTION) IMPLANT

## 2014-07-19 NOTE — Anesthesia Postprocedure Evaluation (Signed)
  Anesthesia Post Note  Patient: Jenna Noble  Procedure(s) Performed: Procedure(s) (LRB): total HYSTERECTOMY ABDOMINAL bilateral salpingectomy  (Bilateral)  Anesthesia type: Spinal  Patient location: PACU  Post pain: Pain level controlled  Post assessment: Post-op Vital signs reviewed  Last Vitals:  Filed Vitals:   07/19/14 0915  BP: 154/73  Pulse: 125  Temp:   Resp: 21    Post vital signs: Reviewed  Level of consciousness: awake  Complications: No apparent anesthesia complications

## 2014-07-19 NOTE — Transfer of Care (Signed)
Immediate Anesthesia Transfer of Care Note  Patient: Jenna Noble  Procedure(s) Performed: Procedure(s): total HYSTERECTOMY ABDOMINAL bilateral salpingectomy  (Bilateral)  Patient Location: PACU  Anesthesia Type:Spinal  Level of Consciousness: awake, alert  and oriented  Airway & Oxygen Therapy: Patient Spontanous Breathing and Patient connected to nasal cannula oxygen  Post-op Assessment: Report given to PACU RN and Post -op Vital signs reviewed and stable  Post vital signs: Reviewed and stable  Complications: No apparent anesthesia complications

## 2014-07-19 NOTE — Anesthesia Preprocedure Evaluation (Addendum)
Anesthesia Evaluation  Patient identified by MRN, date of birth, ID band Patient awake    Reviewed: Allergy & Precautions, H&P , NPO status , Patient's Chart, lab work & pertinent test results  Airway Mallampati: II TM Distance: >3 FB Neck ROM: Full    Dental no notable dental hx.    Pulmonary neg pulmonary ROS,  breath sounds clear to auscultation  Pulmonary exam normal       Cardiovascular hypertension, Pt. on medications negative cardio ROS  Rhythm:Regular Rate:Normal     Neuro/Psych negative neurological ROS  negative psych ROS   GI/Hepatic negative GI ROS, Neg liver ROS,   Endo/Other  negative endocrine ROS  Renal/GU negative Renal ROS  negative genitourinary   Musculoskeletal negative musculoskeletal ROS (+)   Abdominal   Peds negative pediatric ROS (+)  Hematology negative hematology ROS (+)   Anesthesia Other Findings   Reproductive/Obstetrics negative OB ROS                         Anesthesia Physical Anesthesia Plan  ASA: II  Anesthesia Plan: Spinal   Post-op Pain Management:    Induction: Intravenous  Airway Management Planned: Oral ETT  Additional Equipment:   Intra-op Plan:   Post-operative Plan: Extubation in OR  Informed Consent: I have reviewed the patients History and Physical, chart, labs and discussed the procedure including the risks, benefits and alternatives for the proposed anesthesia with the patient or authorized representative who has indicated his/her understanding and acceptance.   Dental advisory given  Plan Discussed with: CRNA  Anesthesia Plan Comments:       Anesthesia Quick Evaluation

## 2014-07-19 NOTE — Addendum Note (Signed)
Addendum created 07/19/14 1551 by Elenore Paddy, CRNA   Modules edited: Notes Section   Notes Section:  File: [id number]

## 2014-07-19 NOTE — Progress Notes (Signed)
Ur chart review completed.  

## 2014-07-19 NOTE — Op Note (Signed)
07/19/2014  8:47 AM  PATIENT:  Jenna Noble  43 y.o. female  PRE-OPERATIVE DIAGNOSIS:  Symptomatic fibroids  POST-OPERATIVE DIAGNOSIS:  same  PROCEDURE:  Procedure(s): total HYSTERECTOMY ABDOMINAL bilateral salpingectomy  (Bilateral)  SURGEON:  Surgeon(s) and Role:    * Emily Filbert, MD - Primary    * Lavonia Drafts, MD - Assisting  PHYSICIAN ASSISTANT:   ASSISTANTS: Blanch Media, MD   ANESTHESIA:   spinal  EBL:  Total I/O In: 1000 [I.V.:1000] Out: 450 [Urine:100; Blood:350]  BLOOD ADMINISTERED:none  DRAINS: none   LOCAL MEDICATIONS USED:  MARCAINE     SPECIMEN:  Source of Specimen:  uterus and oviducts  DISPOSITION OF SPECIMEN:  PATHOLOGY  COUNTS:  YES  TOURNIQUET:  * No tourniquets in log *  DICTATION: .Dragon Dictation  PLAN OF CARE: Admit to inpatient   PATIENT DISPOSITION:  PACU - hemodynamically stable.   Delay start of Pharmacological VTE agent (>24hrs) due to surgical blood loss or risk of bleeding: not applicable     The risks, benefits, and alternatives of surgery were explained, understood, and accepted. Consents were signed. All questions were answered. She was taken to the operating room and spinal anesthesia was applied without complication. Her abdomen and vagina were prepped and draped in the usual sterile fashion. A Foley catheter was placed which drained clear urine throughout the case. After adequate anesthesia was assured a transverse incision was made approximately 2 cm above her symphysis pubis after injecting 30 mL of 0.5% marcaine in the subcutaneous tissue. The incision was carried down through the subcutaneous tissue to the fascia. Bleeding encountered was cauterized with the Bovie. The fascia was scored the midline and the fascial incision was extended bilaterally. The pyramidalis muscles were separated in a transverse fashion using electrosurgical technique. Approximately 2 cm of the rectus muscles were  separated in a transverse fashion in the midline using electrosurgical technique. Hemostasis was maintained. The peritoneum was entered with hemostats and the peritoneal incision was extended bilaterally with the Bovie, taking care to avoid bowel and bladder. The patient was placed in Trendelenburg position and her bowel was packed out of the abdominal cavity. The pelvis was inspected. Her very large uterus filled the entire pelvis. I used towel clamps to elevate the uterus out of the incision. Coker clamps were used to elevate the uterus. The round ligaments were identified clamped cut and ligated. A bladder flap was created anteriorly and the bladder was pushed out of the operative site with a moist lap sponge. The uteroovarian ligaments were identified bilaterally. They were clamped, cut, and ligated. Excellent hemostasis was noted. 2-0 Vicryl sutures used throughout this case unless otherwise specified. The uterine vessels were skeletonized, clamped, cut, and doubly ligated. A bladder flap was created anteriorly. The remainder of the cervix was separated from its pelvic attachments using the same clamp, cut, ligate technique. Curved Heaney clamps were used to clamp beneath the cervix. The cervix and uterus were removed and sent to pathology. The vaginal cuff was noted to be hemostatic after placing 2 figure of eight sutures. The pelvis was irrigated with 1 L of warm normal saline. All pedicles were noted to be hemostatic. I placed a piece of surgicell over the vaginal cuff. I clamped and excised the oviducts. The pedicles were hemostatic.The ureters were noted to be functioning and of normal caliber. The sponges were removed from the pelvis. The rectus muscles were inspected and hemostasis was assured. The fascia was closed with a 0 Vicryl running  nonlocking suture. The subcutaneous tissue was irrigated, clean, dry.  A subcuticular closure was done with 3-0 vicryl suture. Steristrips were placed across the  incision. She tolerated the procedure well and was taken to the recovery room in stable condition. Her Foley catheter drained clear urine throughout.

## 2014-07-19 NOTE — H&P (Signed)
Jenna Noble is an 43 y.o. MH P5  female. She is here today for a TAH/BSO for her symptomatic fibroids. She has pelvic pain daily, worse with her periods.   Pertinent Gynecological History: Menses: flow is excessive with use of 10 pads or tampons on heaviest days Bleeding: menorrhagia Contraception: none DES exposure: denies Blood transfusions: none Sexually transmitted diseases: no past history Previous GYN Procedures: none  Last mammogram: normal Date:  Last pap: normal Date:     Menstrual History:  No LMP recorded.    Past Medical History  Diagnosis Date  . Depression   . Anxiety   . Hypertension     Past Surgical History  Procedure Laterality Date  . Tonsillectomy      Family History  Problem Relation Age of Onset  . Diabetes Mother   . Hypertension Mother   . Hypertension Father   . Cancer Brother     brain tumor    Social History:  reports that she has never smoked. She does not have any smokeless tobacco history on file. She reports that she does not drink alcohol or use illicit drugs.  Allergies: No Known Allergies  Prescriptions prior to admission  Medication Sig Dispense Refill  . bisoprolol-hydrochlorothiazide (ZIAC) 5-6.25 MG per tablet Take by mouth at bedtime. Take 1/2 tablet at bedtime daily      . clonazePAM (KLONOPIN) 0.5 MG tablet Take 0.5 mg by mouth at bedtime.      Marland Kitchen PARoxetine (PAXIL) 20 MG tablet Take 5 mg by mouth daily.        ROS She is unemployed. She has been married for 25 years, describes herself as "frigid" and is rarely orgasmic. Blood pressure 155/94, pulse 89, temperature 98.6 F (37 C), temperature source Oral, resp. rate 20, SpO2 100.00%. Physical Exam Heart- rrr Lungs- CTAB Abd- benign Uterus palpable to U-3  Results for orders placed during the hospital encounter of 07/19/14 (from the past 24 hour(s))  PREGNANCY, URINE     Status: None   Collection Time    07/19/14  6:15 AM      Result Value Ref  Range   Preg Test, Ur NEGATIVE  NEGATIVE    No results found.  Assessment/Plan: Symptomatic fibroids- plan for TAH/BS.  She understands the risks of surgery, including, but not to infection, bleeding, DVTs, damage to bowel, bladder, ureters. She wishes to proceed.     Somnang Mahan C. 07/19/2014, 7:09 AM

## 2014-07-19 NOTE — Anesthesia Procedure Notes (Signed)
Spinal  Patient location during procedure: OR Start time: 07/19/2014 7:26 AM Staffing Anesthesiologist: Rudean Curt Performed by: anesthesiologist  Preanesthetic Checklist Completed: patient identified, site marked, surgical consent, pre-op evaluation, timeout performed, IV checked, risks and benefits discussed and monitors and equipment checked Spinal Block Patient position: sitting Prep: DuraPrep Patient monitoring: heart rate, cardiac monitor, continuous pulse ox and blood pressure Approach: midline Location: L3-4 Injection technique: single-shot Needle Needle type: Sprotte  Needle gauge: 24 G Needle length: 9 cm Assessment Sensory level: T4 Additional Notes Patient identified.  Risk benefits discussed including failed block, incomplete pain control, headache, nerve damage, paralysis, blood pressure changes, nausea, vomiting, reactions to medication both toxic or allergic, and postpartum back pain.  Patient expressed understanding and wished to proceed.  All questions were answered.  Sterile technique used throughout procedure.  CSF was clear.  No parasthesia or other complications.  Please see nursing notes for vital signs.

## 2014-07-19 NOTE — Anesthesia Postprocedure Evaluation (Signed)
  Anesthesia Post-op Note  Patient: Jenna Noble  Procedure(s) Performed: Procedure(s): total HYSTERECTOMY ABDOMINAL bilateral salpingectomy  (Bilateral)  Patient Location: PACU and Women's Unit  Anesthesia Type:Spinal  Level of Consciousness: awake, alert  and oriented  Airway and Oxygen Therapy: Patient Spontanous Breathing  Post-op Pain: mild  Post-op Assessment: Patient's Cardiovascular Status Stable, Respiratory Function Stable, No signs of Nausea or vomiting and Pain level controlled  Post-op Vital Signs: Reviewed and stable  Last Vitals:  Filed Vitals:   07/19/14 1406  BP: 139/79  Pulse: 94  Temp: 36.3 C  Resp: 16    Complications: No apparent anesthesia complications

## 2014-07-20 ENCOUNTER — Encounter (HOSPITAL_COMMUNITY): Payer: Self-pay | Admitting: Obstetrics & Gynecology

## 2014-07-20 DIAGNOSIS — F341 Dysthymic disorder: Secondary | ICD-10-CM

## 2014-07-20 DIAGNOSIS — N8000 Endometriosis of the uterus, unspecified: Secondary | ICD-10-CM

## 2014-07-20 DIAGNOSIS — D251 Intramural leiomyoma of uterus: Principal | ICD-10-CM

## 2014-07-20 DIAGNOSIS — N949 Unspecified condition associated with female genital organs and menstrual cycle: Secondary | ICD-10-CM

## 2014-07-20 LAB — CBC
HCT: 29.6 % — ABNORMAL LOW (ref 36.0–46.0)
HEMOGLOBIN: 9.7 g/dL — AB (ref 12.0–15.0)
MCH: 27.5 pg (ref 26.0–34.0)
MCHC: 32.8 g/dL (ref 30.0–36.0)
MCV: 83.9 fL (ref 78.0–100.0)
Platelets: 242 10*3/uL (ref 150–400)
RBC: 3.53 MIL/uL — AB (ref 3.87–5.11)
RDW: 15.2 % (ref 11.5–15.5)
WBC: 13.4 10*3/uL — ABNORMAL HIGH (ref 4.0–10.5)

## 2014-07-20 LAB — URINE CULTURE
CULTURE: NO GROWTH
Colony Count: NO GROWTH
Special Requests: NORMAL

## 2014-07-20 MED ORDER — IBUPROFEN 600 MG PO TABS: 600.0000 mg | ORAL_TABLET | Freq: Four times a day (QID) | ORAL | Status: AC | PRN

## 2014-07-20 MED ORDER — OXYCODONE-ACETAMINOPHEN 5-325 MG PO TABS: 1.0000 | ORAL_TABLET | ORAL | Status: AC | PRN

## 2014-07-20 NOTE — Progress Notes (Signed)
1 Day Post-Op Procedure(s) (LRB): total HYSTERECTOMY ABDOMINAL bilateral salpingectomy  (Bilateral)  Subjective: Patient reports tolerating PO and + flatus.  She has been ambulating and is requesting discharge home today.  Objective: I have reviewed patient's vital signs, intake and output, medications and labs.  General: alert Resp: clear to auscultation bilaterally Cardio: regular rate and rhythm, S1, S2 normal, no murmur, click, rub or gallop GI: soft, non-tender; bowel sounds normal; no masses,  no organomegaly Extremities: extremities normal, atraumatic, no cyanosis or edema Incision: C/D/I  Assessment: s/p Procedure(s): total HYSTERECTOMY ABDOMINAL bilateral salpingectomy  (Bilateral): progressing well  Plan: Encourage ambulation Discontinue IV fluids and discontinue Foley. I will reevaluate her this evening and let her go home if she still desires discharge.  LOS: 1 day    Oisin Yoakum C. 07/20/2014, 8:14 AM

## 2014-07-20 NOTE — Progress Notes (Signed)
Interpreter, Eda, at bedside

## 2014-07-20 NOTE — Progress Notes (Signed)
Jenna Noble, Interpreter, present at bedside for discharge instructions... No questions or concerns were identified by pt.

## 2014-07-20 NOTE — Discharge Summary (Signed)
Physician Discharge Summary  Patient ID: Jenna Noble MRN: [id number] DOB/AGE: [birth year] 43 y.o.  Admit date: 07/19/2014 Discharge date: 07/20/2014  Admission Diagnoses: symptomatic fibroids  Discharge Diagnoses: same Active Problems:   Post-operative state   Discharged Condition: good  Hospital Course: She underwent an uncomplicated TAH/BS with spinal anesthesia. She was doing so well on the morning of POD#1 that she wanted to go home. I made her wait until the evening when she still said that she wants to go home. She is ambulating, voiding, tolerating po, and having flatus.  Consults: None  Significant Diagnostic Studies: labs: Her post op hbg was stable.  Treatments: surgery: TAH/BS  Discharge Exam: Blood pressure 113/52, pulse 78, temperature 98.6 F (37 C), temperature source Oral, resp. rate 18, height 5' 2.5" (1.588 m), weight 67.132 kg (148 lb), SpO2 100.00%. General appearance: alert Resp: clear to auscultation bilaterally Cardio: regular rate and rhythm, S1, S2 normal, no murmur, click, rub or gallop GI: soft, non-tender; bowel sounds normal; no masses,  no organomegaly Incision: C/D/I  Disposition: home     Medication List         bisoprolol-hydrochlorothiazide 5-6.25 MG per tablet  Commonly known as:  ZIAC  Take 1 tablet by mouth at bedtime.     clonazePAM 0.5 MG tablet  Commonly known as:  KLONOPIN  Take 0.5 mg by mouth at bedtime.     ibuprofen 600 MG tablet  Commonly known as:  ADVIL,MOTRIN  Take 1 tablet (600 mg total) by mouth every 6 (six) hours as needed for mild pain.     oxyCODONE-acetaminophen 5-325 MG per tablet  Commonly known as:  PERCOCET/ROXICET  Take 1-2 tablets by mouth every 4 (four) hours as needed for severe pain (moderate to severe pain (when tolerating fluids)).     PARoxetine 20 MG tablet  Commonly known as:  PAXIL  Take 5 mg by mouth daily.           Follow-up Information   Follow up with Mercer Stallworth  C., MD. Schedule an appointment as soon as possible for a visit in 3 weeks.   Specialty:  Obstetrics and Gynecology   Contact information:   [place] [address] [phone]       Signed: Emily Filbert 07/20/2014, 5:54 PM

## 2014-07-20 NOTE — Discharge Instructions (Signed)
Histerectoma abdominal, cuidados posteriores (Abdominal Hysterectomy, Care After) Siga estas instrucciones durante las prximas semanas. Estas indicaciones le proporcionan informacin general acerca de cmo deber cuidarse despus del procedimiento. El mdico tambin podr darle instrucciones ms especficas. El tratamiento se ha planificado de acuerdo a las prcticas mdicas actuales, pero a veces se producen problemas. Comunquese con el mdico si tiene algn problema o tiene dudas despus del procedimiento.  QU ESPERAR DESPUS DEL PROCEDIMIENTO Despus del procedimiento, es normal tener los siguientes sntomas:  Dolor.  Cansancio.  Prdida del apetito.  Menos inters sexual. INSTRUCCIONES PARA EL CUIDADO EN EL HOGAR  La recuperacin de esta ciruga demora de 4a 6semanas. Asegrese de seguir todas las indicaciones del mdico. Las instrucciones para el cuidado en el hogar pueden incluir:  Tome los medicamentos para calmar el dolor solamente como se lo indic el mdico. No tome medicamentos de venta libre para calmar el dolor sin consultar antes al mdico.  Cambie el vendaje como se lo indic el mdico.  Debe ver nuevamente al mdico para que le saque las suturas.  Tome duchas en lugar de baos durante 2 a 3 semanas. Pregntele al mdico cundo es seguro comenzar a tomar duchas.  No se haga duchas vaginales, no use tampones ni tenga relaciones sexuales durante al menos 6semanas o hasta que el mdico la autorice.  Siga las indicaciones del mdico con respecto a la actividad fsica, a levantar objetos, a conducir el automvil y a las actividades en general.  Descanse y duerma lo suficiente.  No levante nada que sea ms pesado que un galn de leche (alrededor de 10lb [4.5kg]) durante el primer mes posterior a la ciruga.  Puede retomar su dieta normal si el mdico la autoriza.  No beba alcohol hasta que el mdico se lo autorice.  Si tiene estreimiento, pregntele al mdico si  puede tomar un laxante suave.  Los alimentos con alto contenido de fibra tambin pueden tener un efecto laxante. Coma muchas frutas y vegetales crudos, cereales integrales y frijoles.  Beba suficiente lquido para mantener la orina clara o de color amarillo plido.  Trate de que haya alguna persona en su casa para ayudarla con las tareas del hogar durante 1 a 2 semanas despus de la ciruga.  Cumpla con todas las visitas de control. SOLICITE ATENCIN MDICA SI:   Siente escalofros o tiene fiebre.  Tiene sntomas de hinchazn, enrojecimiento o dolor en el rea de la incisin que estn empeorando.  Tiene pus en el lugar de la incisin.  Advierte un olor ftido que proviene de la incisin o del vendaje.  La herida se abre.  Se siente mareada o sufre un desmayo.  Siente dolor o tiene una hemorragia al orinar.  Tiene diarrea persistente.  Tiene nuseas o vmitos persistentes.  Tiene flujo vaginal anormal.  Tiene una erupcin cutnea.  Tiene alguna reaccin anormal o aparece una alergia por los medicamentos.  El medicamento no le calma el dolor. SOLICITE ATENCIN MDICA DE INMEDIATO SI:   Tiene fiebre y los sntomas empeoran repentinamente.  Siente un dolor abdominal intenso.  Siente dolor en el pecho.  Le falta el aire.  Se desmaya.  Siente dolor, u observa hinchazn o enrojecimiento en la pierna.  Tiene una hemorragia vaginal abundante, con cogulos. ASEGRESE DE QUE:  Comprende estas instrucciones.  Controlar su afeccin.  Recibir ayuda de inmediato si no mejora o si empeora. Document Released: 06/23/2007 Document Revised: 12/15/2013 [employer] Patient Information 2015 [employer]. This information is not intended to replace   advice given to you by your health care provider. Make sure you discuss any questions you have with your health care provider.  

## 2014-07-20 NOTE — Progress Notes (Signed)
Pt discharged home with daughter... Discharge instructions reviewed with pt via Interpreter, [place], pt verbalized understanding... Condition stable... No equipment... Ambulated to car with Zenaida Deed, RN.

## 2014-08-11 ENCOUNTER — Ambulatory Visit (INDEPENDENT_AMBULATORY_CARE_PROVIDER_SITE_OTHER): Payer: Self-pay | Admitting: Obstetrics & Gynecology

## 2014-08-11 ENCOUNTER — Encounter: Payer: Self-pay | Admitting: Obstetrics & Gynecology

## 2014-08-11 VITALS — BP 139/76 | HR 79 | Temp 98.4°F | Wt 149.3 lb

## 2014-08-11 DIAGNOSIS — R739 Hyperglycemia, unspecified: Secondary | ICD-10-CM

## 2014-08-11 DIAGNOSIS — Z09 Encounter for follow-up examination after completed treatment for conditions other than malignant neoplasm: Secondary | ICD-10-CM

## 2014-08-11 DIAGNOSIS — R7309 Other abnormal glucose: Secondary | ICD-10-CM

## 2014-08-11 NOTE — Progress Notes (Signed)
   Subjective:    Patient ID: Jenna Noble, female    DOB: 1971-10-23, 43 y.o.   MRN: [id number]  HPI This lovely 43 yo H lady is here at 3 weeks post op for a routine post op visit. She had a TAH/BS for fibroids and her uterus weighed greater than 1,500 grams. She denies any post op problems. She plans to leave for a vacation to [place], [place], and [place] next week. She reports normal bowel and bladder function. She has not had sex since sugery.   Review of Systems  A pre op glucose was 110.    Objective:   Physical Exam  Well-healed incision Cuff well-healed Bimanual reveals no masses      Assessment & Plan:  Post op- doing well RTC 1 year/prn sooner Check HBA1C today

## 2014-08-12 ENCOUNTER — Encounter: Payer: Self-pay | Admitting: *Deleted

## 2014-08-12 LAB — HEMOGLOBIN A1C
Hgb A1c MFr Bld: 6.7 % — ABNORMAL HIGH (ref <5.7)
Mean Plasma Glucose: 146 mg/dL — ABNORMAL HIGH (ref <117)

## 2014-10-25 ENCOUNTER — Encounter: Payer: Self-pay | Admitting: Obstetrics & Gynecology

## 2015-02-09 IMAGING — US US TRANSVAGINAL NON-OB
1 series · 14 of 25 positions shown · non-contrast
Comparison: None

CLINICAL DATA: Menorrhagia, fibroids, abnormal uterine bleeding

EXAM:
TRANSABDOMINAL AND TRANSVAGINAL ULTRASOUND OF PELVIS
TECHNIQUE: Both transabdominal and transvaginal ultrasound examinations of the
pelvis were performed. Transabdominal technique was performed for
global imaging of the pelvis including uterus, ovaries, adnexal
regions, and pelvic cul-de-sac. It was necessary to proceed with
endovaginal exam following the transabdominal exam to visualize the
ovaries and endometrium.

[Series 1: us pelvis complete · 14 of 71 slices shown]
[im 1/71]
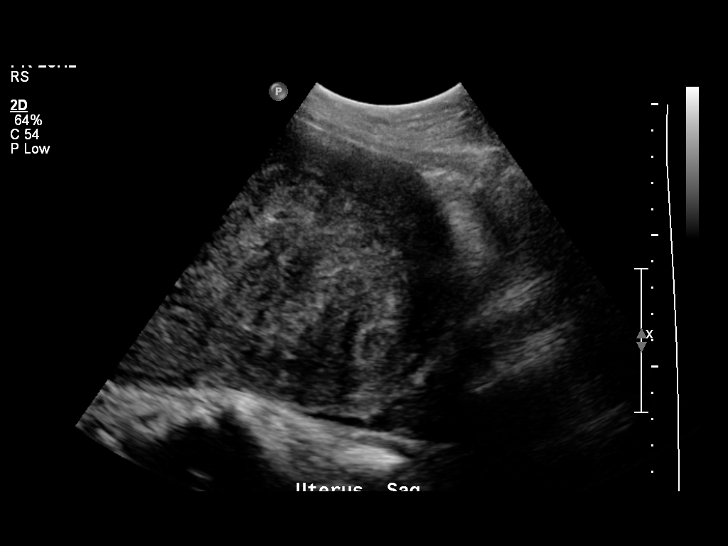
[im 6/71]
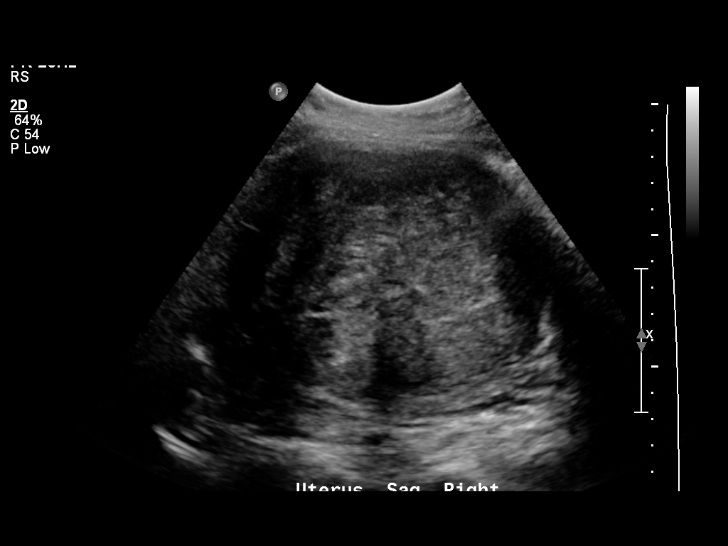
[im 12/71]
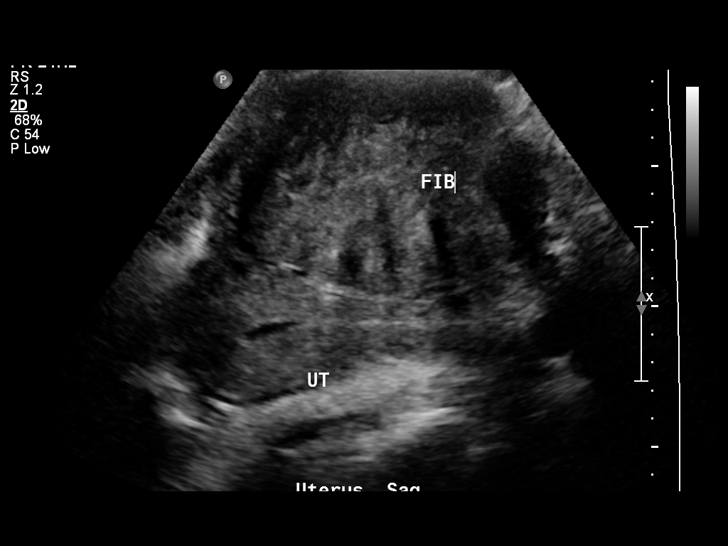
[im 18/71]
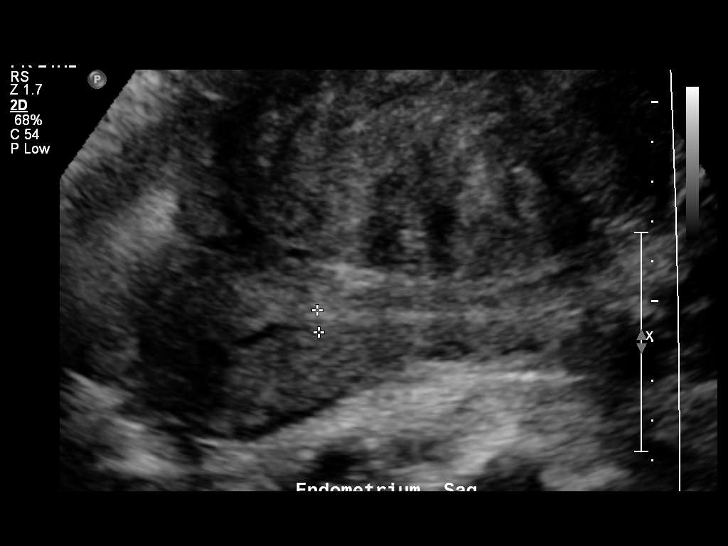
[im 24/71]
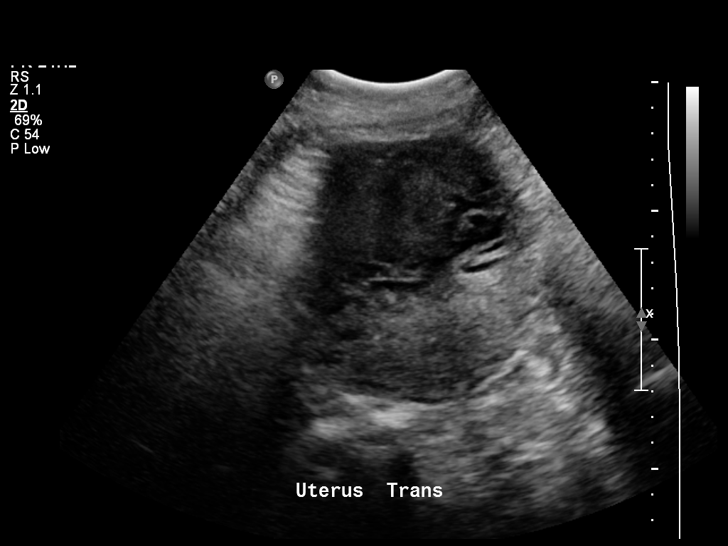
[im 27/71]
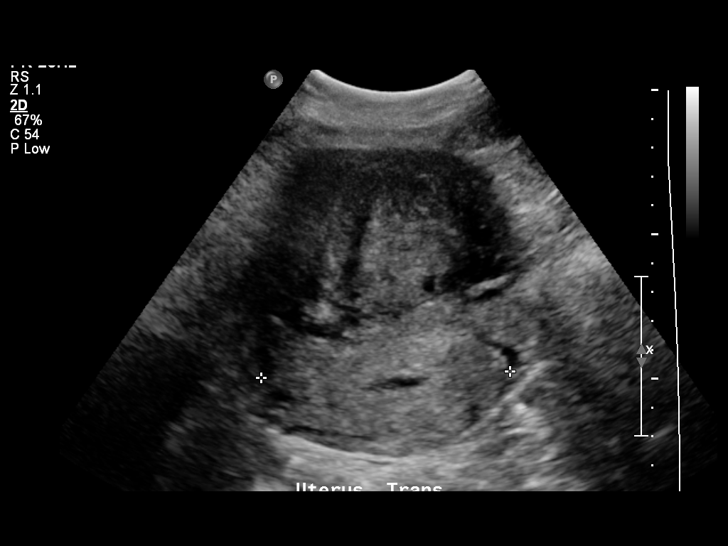
[im 33/71]
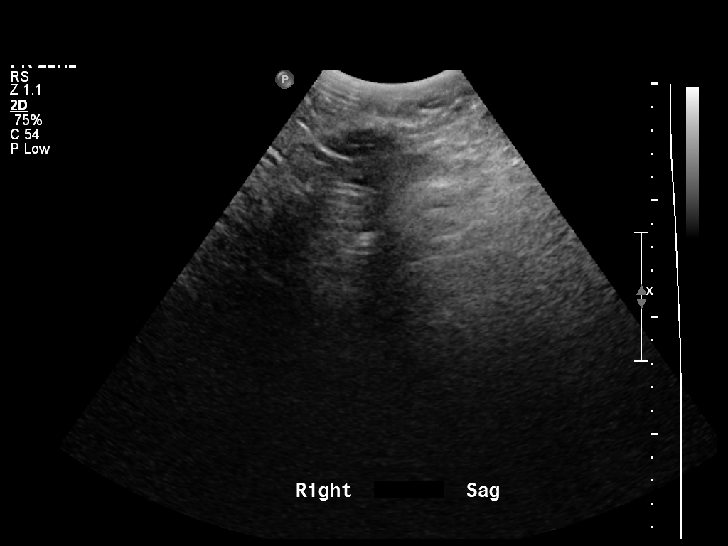
[im 38/71]
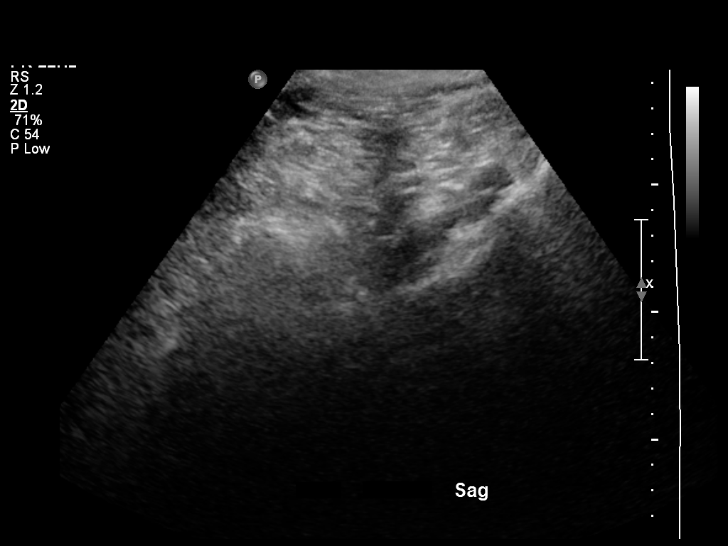
[im 44/71]
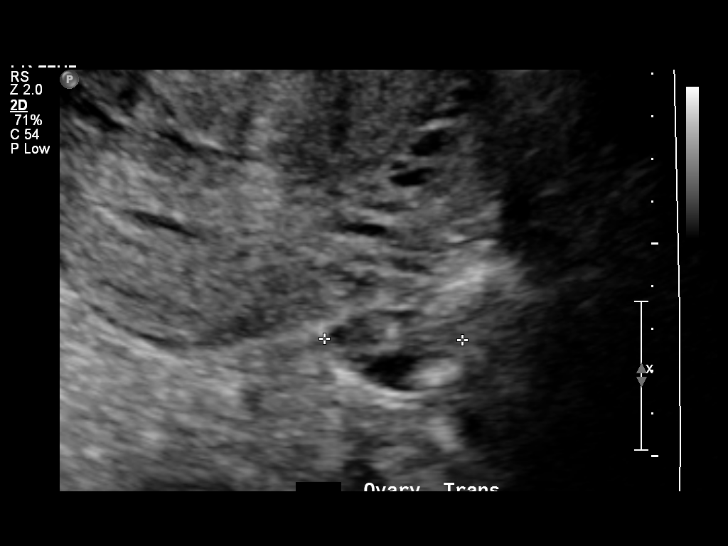
[im 47/71]
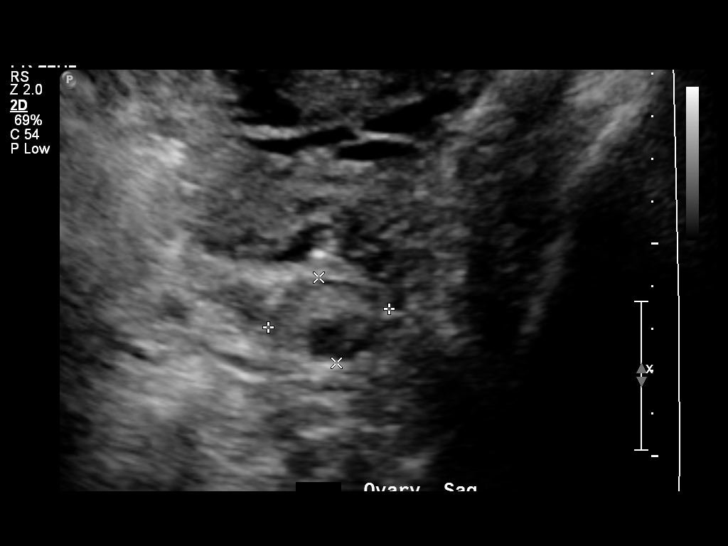
[im 53/71]
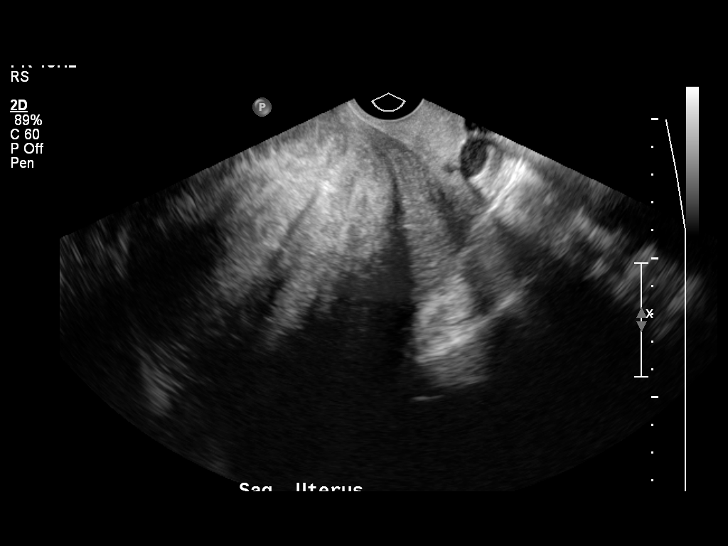
[im 59/71]
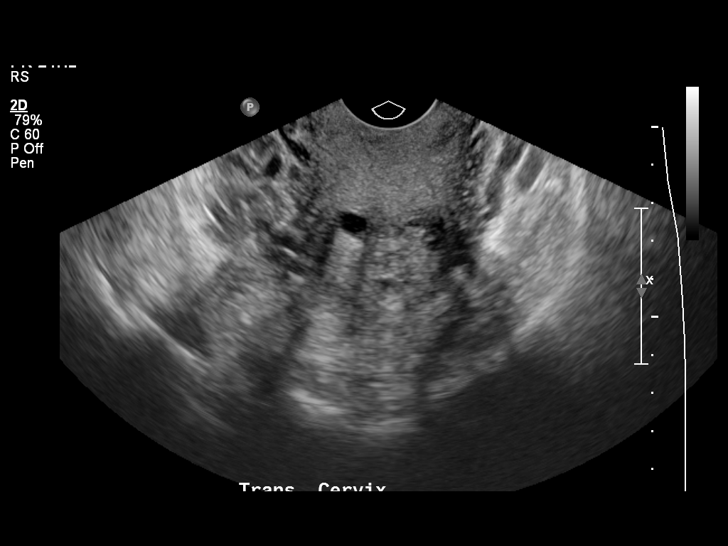
[im 65/71]
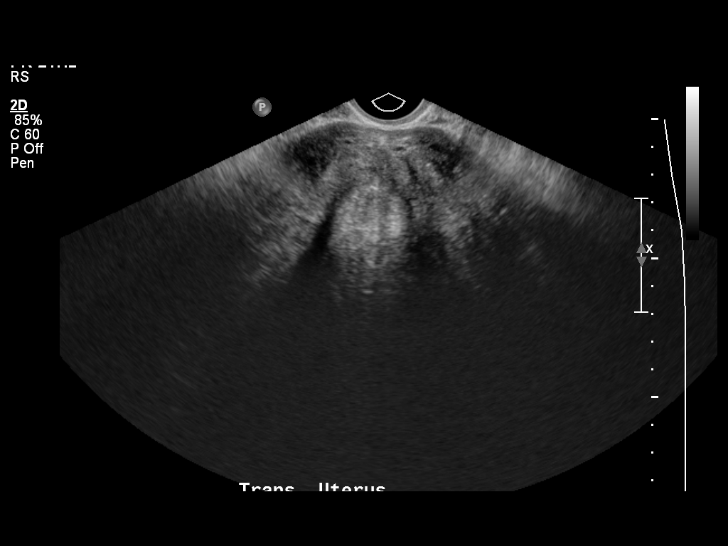
[im 71/71]
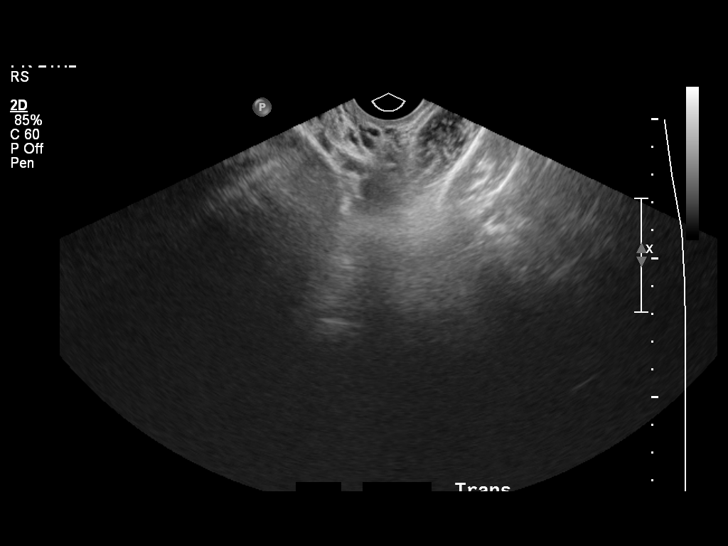

[14 of 25 positions shown; findings below may reference images not displayed]

FINDINGS: Uterus

Measurements: 15.8 x 5.4 x 8.6 cm. There is a large anterior
heterogeneously hypoechoic mass measuring 11.1 x 8.8 x 12 cm most
consistent with a uterine fibroid.

Endometrium

Thickness: 6 mm.  No focal abnormality visualized.

Right ovary

Not visualized transabdominally or transvaginally.

Left ovary

Measurements: 2.9 x 2 x 3.2 cm. Normal appearance/no adnexal mass.

Other findings

No free fluid.
IMPRESSION: 1. Large anterior uterine fibroid measuring 11.1 x 8.8 x 12 cm.
2. Nonvisualized right ovary.
